# Patient Record
Sex: Female | Born: 1985 | Race: White | Hispanic: No | Marital: Single | Smoking: Former smoker
Health system: Southern US, Community
[De-identification: ages and names within clinical notes are randomized; demographics above are authoritative.]

## PROBLEM LIST (undated history)

## (undated) DIAGNOSIS — I10 Essential (primary) hypertension: Secondary | ICD-10-CM

## (undated) DIAGNOSIS — K589 Irritable bowel syndrome without diarrhea: Secondary | ICD-10-CM

## (undated) DIAGNOSIS — E282 Polycystic ovarian syndrome: Secondary | ICD-10-CM

## (undated) DIAGNOSIS — R000 Tachycardia, unspecified: Secondary | ICD-10-CM

## (undated) DIAGNOSIS — R519 Headache, unspecified: Secondary | ICD-10-CM

## (undated) DIAGNOSIS — K859 Acute pancreatitis without necrosis or infection, unspecified: Secondary | ICD-10-CM

## (undated) DIAGNOSIS — N809 Endometriosis, unspecified: Secondary | ICD-10-CM

## (undated) HISTORY — PX: LAPAROSCOPIC GASTRIC SLEEVE RESECTION: SHX5895

## (undated) HISTORY — DX: Polycystic ovarian syndrome: E28.2

## (undated) HISTORY — PX: APPENDECTOMY: SHX54

## (undated) HISTORY — DX: Acute pancreatitis without necrosis or infection, unspecified: K85.90

## (undated) HISTORY — PX: DILATION AND CURETTAGE OF UTERUS: SHX78

## (undated) HISTORY — DX: Irritable bowel syndrome without diarrhea: K58.9

## (undated) HISTORY — PX: CHOLECYSTECTOMY: SHX55

---

## 2010-03-22 DIAGNOSIS — O1490 Unspecified pre-eclampsia, unspecified trimester: Secondary | ICD-10-CM

## 2013-10-23 DIAGNOSIS — O1490 Unspecified pre-eclampsia, unspecified trimester: Secondary | ICD-10-CM

## 2014-04-07 HISTORY — PX: LAPAROSCOPIC GASTRIC SLEEVE RESECTION: SHX5895

## 2016-10-22 DIAGNOSIS — O1490 Unspecified pre-eclampsia, unspecified trimester: Secondary | ICD-10-CM

## 2017-06-23 DIAGNOSIS — G43909 Migraine, unspecified, not intractable, without status migrainosus: Secondary | ICD-10-CM | POA: Insufficient documentation

## 2017-06-23 DIAGNOSIS — K219 Gastro-esophageal reflux disease without esophagitis: Secondary | ICD-10-CM | POA: Insufficient documentation

## 2017-06-23 DIAGNOSIS — E282 Polycystic ovarian syndrome: Secondary | ICD-10-CM | POA: Insufficient documentation

## 2019-05-09 HISTORY — PX: APPENDECTOMY: SHX54

## 2019-10-18 ENCOUNTER — Encounter (HOSPITAL_COMMUNITY): Payer: Self-pay | Admitting: Emergency Medicine

## 2019-10-18 ENCOUNTER — Inpatient Hospital Stay (HOSPITAL_COMMUNITY)
Admission: EM | Admit: 2019-10-18 | Discharge: 2019-10-18 | Disposition: A | Discharge status: Home / Self Care | Payer: Medicaid Other | Attending: Family Medicine | Admitting: Family Medicine

## 2019-10-18 ENCOUNTER — Inpatient Hospital Stay (HOSPITAL_COMMUNITY): Payer: Medicaid Other

## 2019-10-18 ENCOUNTER — Other Ambulatory Visit: Payer: Self-pay

## 2019-10-18 DIAGNOSIS — O98811 Other maternal infectious and parasitic diseases complicating pregnancy, first trimester: Secondary | ICD-10-CM | POA: Diagnosis not present

## 2019-10-18 DIAGNOSIS — B3731 Acute candidiasis of vulva and vagina: Secondary | ICD-10-CM

## 2019-10-18 DIAGNOSIS — Z3491 Encounter for supervision of normal pregnancy, unspecified, first trimester: Secondary | ICD-10-CM

## 2019-10-18 DIAGNOSIS — Z3A08 8 weeks gestation of pregnancy: Secondary | ICD-10-CM | POA: Insufficient documentation

## 2019-10-18 DIAGNOSIS — O3411 Maternal care for benign tumor of corpus uteri, first trimester: Secondary | ICD-10-CM | POA: Diagnosis not present

## 2019-10-18 DIAGNOSIS — O10911 Unspecified pre-existing hypertension complicating pregnancy, first trimester: Secondary | ICD-10-CM | POA: Insufficient documentation

## 2019-10-18 DIAGNOSIS — D259 Leiomyoma of uterus, unspecified: Secondary | ICD-10-CM | POA: Insufficient documentation

## 2019-10-18 DIAGNOSIS — B373 Candidiasis of vulva and vagina: Secondary | ICD-10-CM | POA: Insufficient documentation

## 2019-10-18 DIAGNOSIS — O209 Hemorrhage in early pregnancy, unspecified: Secondary | ICD-10-CM | POA: Insufficient documentation

## 2019-10-18 DIAGNOSIS — O99891 Other specified diseases and conditions complicating pregnancy: Secondary | ICD-10-CM

## 2019-10-18 DIAGNOSIS — Z9049 Acquired absence of other specified parts of digestive tract: Secondary | ICD-10-CM | POA: Insufficient documentation

## 2019-10-18 DIAGNOSIS — O4691 Antepartum hemorrhage, unspecified, first trimester: Secondary | ICD-10-CM

## 2019-10-18 DIAGNOSIS — N939 Abnormal uterine and vaginal bleeding, unspecified: Secondary | ICD-10-CM

## 2019-10-18 HISTORY — DX: Endometriosis, unspecified: N80.9

## 2019-10-18 HISTORY — DX: Headache, unspecified: R51.9

## 2019-10-18 HISTORY — DX: Essential (primary) hypertension: I10

## 2019-10-18 HISTORY — DX: Tachycardia, unspecified: R00.0

## 2019-10-18 LAB — URINALYSIS, ROUTINE W REFLEX MICROSCOPIC
Bilirubin Urine: NEGATIVE
Glucose, UA: NEGATIVE mg/dL
Hgb urine dipstick: NEGATIVE
Ketones, ur: NEGATIVE mg/dL
Leukocytes,Ua: NEGATIVE
Nitrite: NEGATIVE
Protein, ur: NEGATIVE mg/dL
Specific Gravity, Urine: 1.014 (ref 1.005–1.030)
pH: 7 (ref 5.0–8.0)

## 2019-10-18 LAB — WET PREP, GENITAL
Clue Cells Wet Prep HPF POC: NONE SEEN
Sperm: NONE SEEN
Trich, Wet Prep: NONE SEEN

## 2019-10-18 LAB — CBC
HCT: 40.7 % (ref 36.0–46.0)
Hemoglobin: 13.3 g/dL (ref 12.0–15.0)
MCH: 29.4 pg (ref 26.0–34.0)
MCHC: 32.7 g/dL (ref 30.0–36.0)
MCV: 89.8 fL (ref 80.0–100.0)
Platelets: 279 10*3/uL (ref 150–400)
RBC: 4.53 MIL/uL (ref 3.87–5.11)
RDW: 11.9 % (ref 11.5–15.5)
WBC: 11.4 10*3/uL — ABNORMAL HIGH (ref 4.0–10.5)
nRBC: 0 % (ref 0.0–0.2)

## 2019-10-18 LAB — I-STAT BETA HCG BLOOD, ED (MC, WL, AP ONLY): I-stat hCG, quantitative: >2000 m[IU]/mL — ABNORMAL HIGH (ref <5)

## 2019-10-18 LAB — ABO/RH: ABO/RH(D): B POS

## 2019-10-18 LAB — HCG, QUANTITATIVE, PREGNANCY: hCG, Beta Chain, Quant, S: 114922 m[IU]/mL — ABNORMAL HIGH (ref <5)

## 2019-10-18 MED ORDER — TERCONAZOLE 0.4 % VA CREA
1.0000 | TOPICAL_CREAM | Freq: Every day | VAGINAL | 0 refills | Status: DC
Start: 2019-10-18 — End: 2019-11-23

## 2019-10-18 NOTE — ED Provider Notes (Signed)
Patient placed in Quick Look pathway, seen and evaluated   Chief Complaint: Vaginal bleeding in pregnanacy  HPI:   [id number] here with cramping and vaginal bleeding. LMP 08/19/2019  ROS: vaginal bleeding (one)  Physical Exam:   Gen: No distress  Neuro: Awake and Alert  Skin: Warm    Focused Exam: no abdominal tenderness   Spoke with Veronica, MAU APP who has accepted the patient.  Initiation of care has begun. The patient has been counseled on the process, plan, and necessity for staying for the completion/evaluation, and the remainder of the medical screening examination    Ned Grace 10/18/19 2219    Daleen Bo, MD 10/18/19 2302

## 2019-10-18 NOTE — ED Triage Notes (Signed)
PA Harris spoke to MAU provider, approval to send pt.  RN called Lauren in MAU and gave report.  RN for transport

## 2019-10-18 NOTE — ED Triage Notes (Signed)
Pt reports vaginal spotting today, she reports she is [redacted] weeks pregnant, just moved her and has not seen a provider yet.  She did have a miscarriage a year ago.  Reports some mild cramping and occasional sharp pains.

## 2019-10-18 NOTE — MAU Note (Signed)
Pt stated she has been having cramping and spotting that started earlier today. Pt hs Hx endometriosis.

## 2019-10-18 NOTE — MAU Provider Note (Signed)
History     CSN: [id number]  Arrival date and time: 10/18/19 2010   First Provider Initiated Contact with Patient 10/18/19 2224      Chief Complaint  Patient presents with  . Vaginal Bleeding   Momina Crystal Pineau is a 34 y.o. G6P3 at [redacted]w[redacted]d by LMP who presents to MAU with complaints of vaginal bleeding and abdominal pain. Patient reports symptoms started occurring today. Describes vaginal bleeding as dark brownish red spotting when she wipes, denies having to wear a pad or panty liner for vaginal bleeding. She reports lower abdominal cramping that is intermittent, describes as cramping that is sometimes sharp pain, rates 3/10- has not taken any medication for pain. Patient reports that she was recently diagnosed with endometriosis during her appendectomy in Feb 2021.    OB History    Gravida  6   Para  3   Term      Preterm  3   AB  2   Living  3     SAB  2   TAB      Ectopic      Multiple      Live Births  3           Past Medical History:  Diagnosis Date  . Endometriosis   . Headache   . Hypertension   . Tachycardia     Past Surgical History:  Procedure Laterality Date  . APPENDECTOMY    . CESAREAN SECTION    . LAPAROSCOPIC GASTRIC SLEEVE RESECTION      No family history on file.  Social History   Tobacco Use  . Smoking status: Never Smoker  . Smokeless tobacco: Never Used  Substance Use Topics  . Alcohol use: Not Currently  . Drug use: Never    Allergies:  Allergies  Allergen Reactions  . Latex Hives  . Shellfish Allergy Nausea And Vomiting    Medications Prior to Admission  Medication Sig Dispense Refill Last Dose  . folic acid (FOLVITE) 371 MCG tablet Take 400 mcg by mouth daily.     . METHYLDOPA PO Take 1 tablet by mouth every morning.     . Prenatal Vit-Fe Fumarate-FA (PRENATAL MULTIVITAMIN) TABS tablet Take 1 tablet by mouth daily at 12 noon.     . propranolol (INNOPRAN XL) 80 MG 24 hr capsule Take 80 mg by mouth at bedtime.        Review of Systems  Constitutional: Negative.   Respiratory: Negative.   Cardiovascular: Negative.   Gastrointestinal: Positive for abdominal pain. Negative for constipation, diarrhea, nausea and vomiting.  Genitourinary: Positive for vaginal bleeding. Negative for difficulty urinating, dysuria, frequency, pelvic pain and urgency.  Musculoskeletal: Negative.   Neurological: Negative.   Psychiatric/Behavioral: Negative.    Physical Exam   Blood pressure 117/83, pulse 83, temperature 98.8 F (37.1 C), temperature source Oral, resp. rate 16, height 5\' 4"  (1.626 m), weight 103 kg, last menstrual period 08/19/2019, SpO2 99 %.  Physical Exam Vitals and nursing note reviewed.  HENT:     Head: Normocephalic.  Cardiovascular:     Rate and Rhythm: Normal rate and regular rhythm.  Pulmonary:     Effort: Pulmonary effort is normal. No respiratory distress.     Breath sounds: Normal breath sounds. No wheezing.  Abdominal:     General: There is no distension.     Palpations: Abdomen is soft. There is no mass.     Tenderness: There is no abdominal tenderness. There is no guarding.  Genitourinary:    Comments: Blind swabs collected, dark brown curdy discharge noticed on swabs  Bimanual exam: Cervix 0/long/high, firm, anterior, neg CMT, uterus nontender, nonenlarged, adnexa without tenderness, enlargement, or mass  Skin:    General: Skin is warm and dry.  Neurological:     Mental Status: She is alert and oriented to person, place, and time.  Psychiatric:        Mood and Affect: Mood normal.        Behavior: Behavior normal.        Thought Content: Thought content normal.     MAU Course  Procedures  MDM Orders Placed This Encounter  Procedures  . Wet prep, genital  . US OB Comp Less 14 Wks  . CBC  . hCG, quantitative, pregnancy  . Urinalysis, Routine w reflex microscopic  . Diet NPO time specified  . I-Stat beta hCG blood, ED  . ABO/Rh   Labs and [place] report reviewed:   Results for orders placed or performed during the hospital encounter of 10/18/19 (from the past 24 hour(s))  I-Stat beta hCG blood, ED     Status: Abnormal   Collection Time: 10/18/19  8:44 PM  Result Value Ref Range   I-stat hCG, quantitative >2,000.0 (H) <5 mIU/mL   Comment 3          CBC     Status: Abnormal   Collection Time: 10/18/19 10:10 PM  Result Value Ref Range   WBC 11.4 (H) 4.0 - 10.5 K/uL   RBC 4.53 3.87 - 5.11 MIL/uL   Hemoglobin 13.3 12.0 - 15.0 g/dL   HCT 40.7 36 - 46 %   MCV 89.8 80.0 - 100.0 fL   MCH 29.4 26.0 - 34.0 pg   MCHC 32.7 30.0 - 36.0 g/dL   RDW 11.9 11.5 - 15.5 %   Platelets 279 150 - 400 K/uL   nRBC 0.0 0.0 - 0.2 %  ABO/Rh     Status: None   Collection Time: 10/18/19 10:10 PM  Result Value Ref Range   ABO/RH(D) B POS    No rh immune globuloin      NOT A RH IMMUNE GLOBULIN CANDIDATE, PT RH POSITIVE Performed at [facility], [place] [address], [place], [place] [address]   hCG, quantitative, pregnancy     Status: Abnormal   Collection Time: 10/18/19 10:10 PM  Result Value Ref Range   hCG, Beta Chain, Laqueta Carina 784,696 (H) <5 mIU/mL  Wet prep, genital     Status: Abnormal   Collection Time: 10/18/19 10:15 PM   Specimen: Vaginal  Result Value Ref Range   Yeast Wet Prep HPF POC PRESENT (A) NONE SEEN   Trich, Wet Prep NONE SEEN NONE SEEN   Clue Cells Wet Prep HPF POC NONE SEEN NONE SEEN   WBC, Wet Prep HPF POC MANY (A) NONE SEEN   Sperm NONE SEEN   Urinalysis, Routine w reflex microscopic     Status: Abnormal   Collection Time: 10/18/19 10:15 PM  Result Value Ref Range   Color, Urine YELLOW YELLOW   APPearance HAZY (A) CLEAR   Specific Gravity, Urine 1.014 1.005 - 1.030   pH 7.0 5.0 - 8.0   Glucose, UA NEGATIVE NEGATIVE mg/dL   Hgb urine dipstick NEGATIVE NEGATIVE   Bilirubin Urine NEGATIVE NEGATIVE   Ketones, ur NEGATIVE NEGATIVE mg/dL   Protein, ur NEGATIVE NEGATIVE mg/dL   Nitrite NEGATIVE NEGATIVE   Leukocytes,Ua NEGATIVE NEGATIVE    US OB  Comp Less 14 Wks  Result Date: 10/18/2019 CLINICAL DATA:  34 year old pregnant female with spotting. LMP: 08/19/2019 corresponding to an estimated gestational age of [redacted] weeks, 4 days. EXAM: OBSTETRIC <14 WK ULTRASOUND TECHNIQUE: Transabdominal ultrasound was performed for evaluation of the gestation as well as the maternal uterus and adnexal regions. COMPARISON:  None. FINDINGS: Intrauterine gestational sac: Single intrauterine gestational sac. Yolk sac:  Seen Embryo:  Present Cardiac Activity: Detected Heart Rate: 176 bpm CRL: 18 mm   8 w 2 d                  [place] EDC: 05/27/2020 Subchorionic hemorrhage:  None visualized. Maternal uterus/adnexae: There is a 2.9 x 2.3 x 2.0 cm anterior body fibroid. The ovaries are unremarkable. There is a corpus luteum in the right ovary. IMPRESSION: Single live intrauterine pregnancy with an estimated gestational age of [redacted] weeks, 2 days based on today's ultrasound concordant with age based on LMP. Electronically Signed   By: Anner Crete M.D.   On: 10/18/2019 22:58   Discussed results of [place] and lab work with patient. Discussed with patient that she does have yeast infection which explains discharge, Rx for terazol sent to pharmacy of choice. Normal IUP with FHR noted on [place]. Encouraged patient to make initial prenatal visit.   Discussed reasons to return to MAU. Return to MAU as needed. Pt stable at time of discharge.   Assessment and Plan   1. Normal IUP (intrauterine pregnancy) on prenatal ultrasound, first trimester   2. Vaginal spotting   3. [redacted] weeks gestation of pregnancy   4. Vulvovaginal candidiasis    Discharge home Make initial prenatal visit to start care  Return to MAU as needed for reasons discussed and/or emergencies  Rx for Terazol     Allergies as of 10/18/2019      Reactions   Latex Hives   Shellfish Allergy Nausea And Vomiting      Medication List    TAKE these medications   folic acid 357 MCG tablet Commonly known as:  FOLVITE Take 400 mcg by mouth daily.   METHYLDOPA PO Take 1 tablet by mouth every morning.   prenatal multivitamin Tabs tablet Take 1 tablet by mouth daily at 12 noon.   propranolol 80 MG 24 hr capsule Commonly known as: INNOPRAN XL Take 80 mg by mouth at bedtime.   terconazole 0.4 % vaginal cream Commonly known as: TERAZOL 7 Place 1 applicator vaginally at bedtime. Use for five days       Lajean Manes [facility]  10/19/2019, 12:50 AM

## 2019-10-19 LAB — GC/CHLAMYDIA PROBE AMP (~~LOC~~) NOT AT ARMC
Chlamydia: NEGATIVE
Comment: NEGATIVE
Comment: NORMAL
Neisseria Gonorrhea: NEGATIVE

## 2019-11-11 ENCOUNTER — Inpatient Hospital Stay (HOSPITAL_COMMUNITY)
Admission: AD | Admit: 2019-11-11 | Discharge: 2019-11-11 | Disposition: A | Discharge status: Home / Self Care | Payer: Medicaid Other | Attending: Obstetrics and Gynecology | Admitting: Obstetrics and Gynecology

## 2019-11-11 ENCOUNTER — Encounter (HOSPITAL_COMMUNITY): Payer: Self-pay | Admitting: Obstetrics and Gynecology

## 2019-11-11 ENCOUNTER — Other Ambulatory Visit: Payer: Self-pay

## 2019-11-11 DIAGNOSIS — R519 Headache, unspecified: Secondary | ICD-10-CM | POA: Insufficient documentation

## 2019-11-11 DIAGNOSIS — O98811 Other maternal infectious and parasitic diseases complicating pregnancy, first trimester: Secondary | ICD-10-CM | POA: Insufficient documentation

## 2019-11-11 DIAGNOSIS — A059 Bacterial foodborne intoxication, unspecified: Secondary | ICD-10-CM | POA: Diagnosis not present

## 2019-11-11 DIAGNOSIS — O26891 Other specified pregnancy related conditions, first trimester: Secondary | ICD-10-CM | POA: Diagnosis not present

## 2019-11-11 DIAGNOSIS — Z3A12 12 weeks gestation of pregnancy: Secondary | ICD-10-CM | POA: Insufficient documentation

## 2019-11-11 DIAGNOSIS — O10911 Unspecified pre-existing hypertension complicating pregnancy, first trimester: Secondary | ICD-10-CM | POA: Insufficient documentation

## 2019-11-11 DIAGNOSIS — Z79899 Other long term (current) drug therapy: Secondary | ICD-10-CM | POA: Insufficient documentation

## 2019-11-11 DIAGNOSIS — Z9049 Acquired absence of other specified parts of digestive tract: Secondary | ICD-10-CM | POA: Insufficient documentation

## 2019-11-11 LAB — URINALYSIS, ROUTINE W REFLEX MICROSCOPIC
Bilirubin Urine: NEGATIVE
Glucose, UA: NEGATIVE mg/dL
Hgb urine dipstick: NEGATIVE
Ketones, ur: NEGATIVE mg/dL
Leukocytes,Ua: NEGATIVE
Nitrite: NEGATIVE
Protein, ur: NEGATIVE mg/dL
Specific Gravity, Urine: 1.017 (ref 1.005–1.030)
pH: 8 (ref 5.0–8.0)

## 2019-11-11 MED ORDER — ACETAMINOPHEN 500 MG PO TABS
1000.0000 mg | ORAL_TABLET | Freq: Once | ORAL | Status: AC
  Administered 2019-11-11: 1000 mg via ORAL
  Filled 2019-11-11: qty 2

## 2019-11-11 MED ORDER — ONDANSETRON 4 MG PO TBDP
8.0000 mg | ORAL_TABLET | Freq: Once | ORAL | Status: AC
  Administered 2019-11-11: 8 mg via ORAL
  Filled 2019-11-11: qty 2

## 2019-11-11 MED ORDER — LOPERAMIDE HCL 2 MG PO CAPS
2.0000 mg | ORAL_CAPSULE | Freq: Four times a day (QID) | ORAL | 0 refills | Status: DC | PRN
Start: 2019-11-11 — End: 2019-11-23

## 2019-11-11 MED ORDER — ONDANSETRON 4 MG PO TBDP
4.0000 mg | ORAL_TABLET | Freq: Three times a day (TID) | ORAL | 0 refills | Status: DC | PRN
Start: 2019-11-11 — End: 2020-02-03

## 2019-11-11 NOTE — ED Provider Notes (Signed)
34 yo female with complaint of diarrhea and vomiting with cramping after eating out (ate steak with potatoes). Denies fevers, chills, urinary symptoms, vaginal bleeding. Diarrhea described as loose stools x 3 episodes, non bloody. Vomiting x 3-4 times, non bloody. LMP5/13/21, prenatal care- [facility]st [facility]  G6P3 (loss at 11 weeks 10/2018, sp ab at 5weeks 01/2019), 3 live births premature.   PE: abdomen soft, non tender, well appearing, vitals reassuring.   Discussed with MAU APP Elmyra Ricks, patient may go to MAU for further evaluation.   MSE was initiated and I personally evaluated the patient and placed orders (if any) at  8:52 PM on November 11, 2019.  The patient appears stable so that the remainder of the MSE may be completed by another provider.   Tacy Learn, PA-C 11/11/19 2052    Virgel Manifold, MD 11/11/19 [phone]

## 2019-11-11 NOTE — MAU Note (Signed)
Pt transferred from ED with complaint of nausea and vomiting and diarrhea after eating today.

## 2019-11-11 NOTE — ED Triage Notes (Signed)
Pt c/o nausea/vomiting/diarrhea that started today after eating. Pt is [redacted] weeks pregnant.

## 2019-11-11 NOTE — MAU Provider Note (Signed)
History     CSN: [id number]  Arrival date and time: 11/11/19 2039   First Provider Initiated Contact with Patient 11/11/19 2156      Chief Complaint  Patient presents with  . Emesis   Megan Mcneil is a 34 y.o. [id number] at [redacted]w[redacted]d who receives care at [facility].  She presents today for Emesis and Diarrhea.  Patient states she went out to eat around 430pm at [facility].  She ate steak tips (medium) and mashed potatoes.  She reports she started having nausea around 630pm and then the onset of abdominal cramping, diarrhea, and vomiting. Patient states she has not had any incidents of vomiting, cramping, or diarrhea since arrival.  She also reports headache in her left temporal area that is a 4/10.     OB History    Gravida  6   Para  3   Term      Preterm  3   AB  2   Living  3     SAB  2   TAB      Ectopic      Multiple      Live Births  3           Past Medical History:  Diagnosis Date  . Endometriosis   . Headache   . Hypertension   . Tachycardia     Past Surgical History:  Procedure Laterality Date  . APPENDECTOMY    . CESAREAN SECTION    . CHOLECYSTECTOMY    . LAPAROSCOPIC GASTRIC SLEEVE RESECTION      History reviewed. No pertinent family history.  Social History   Tobacco Use  . Smoking status: Never Smoker  . Smokeless tobacco: Never Used  Substance Use Topics  . Alcohol use: Not Currently  . Drug use: Never    Allergies:  Allergies  Allergen Reactions  . Latex Hives  . Shellfish Allergy Nausea And Vomiting    Medications Prior to Admission  Medication Sig Dispense Refill Last Dose  . folic acid (FOLVITE) 778 MCG tablet Take 400 mcg by mouth daily.   11/11/2019 at Unknown time  . METHYLDOPA PO Take 1 tablet by mouth every morning.   11/11/2019 at Unknown time  . Prenatal Vit-Fe Fumarate-FA (PRENATAL MULTIVITAMIN) TABS tablet Take 1 tablet by mouth daily at 12 noon.   11/11/2019 at Unknown time  . propranolol (INNOPRAN XL) 80 MG 24 hr  capsule Take 80 mg by mouth at bedtime.   Unknown at Unknown time  . terconazole (TERAZOL 7) 0.4 % vaginal cream Place 1 applicator vaginally at bedtime. Use for five days 45 g 0     Review of Systems  Constitutional: Negative for chills and fever.  Respiratory: Negative for cough and shortness of breath.   Gastrointestinal: Positive for abdominal pain (Cramping earlier, none currently. ), diarrhea (Last bout at 2000), nausea and vomiting (Last vomited at 2000). Negative for constipation.  Genitourinary: Negative for difficulty urinating, dysuria, vaginal bleeding and vaginal discharge.  Neurological: Positive for headaches (4/10).   Physical Exam   Blood pressure 116/71, pulse 89, temperature 98.3 F (36.8 C), temperature source Oral, resp. rate 17, last menstrual period 08/19/2019, SpO2 99 %.  Physical Exam Vitals reviewed.  Constitutional:      General: She is not in acute distress.    Appearance: Normal appearance. She is obese. She is not ill-appearing or toxic-appearing.  HENT:     Head: Normocephalic and atraumatic.  Eyes:     Conjunctiva/sclera: Conjunctivae  normal.  Cardiovascular:     Rate and Rhythm: Normal rate.  Pulmonary:     Effort: Pulmonary effort is normal. No respiratory distress.  Musculoskeletal:        General: Normal range of motion.     Cervical back: Normal range of motion.  Neurological:     Mental Status: She is alert and oriented to person, place, and time.  Psychiatric:        Mood and Affect: Mood normal.        Behavior: Behavior normal.        Thought Content: Thought content normal.        Judgment: Judgment normal.     MAU Course  Procedures Results for orders placed or performed during the hospital encounter of 11/11/19 (from the past 24 hour(s))  Urinalysis, Routine w reflex microscopic Urine, Clean Catch     Status: Abnormal   Collection Time: 11/11/19  9:19 PM  Result Value Ref Range   Color, Urine YELLOW YELLOW   APPearance  CLOUDY (A) CLEAR   Specific Gravity, Urine 1.017 1.005 - 1.030   pH 8.0 5.0 - 8.0   Glucose, UA NEGATIVE NEGATIVE mg/dL   Hgb urine dipstick NEGATIVE NEGATIVE   Bilirubin Urine NEGATIVE NEGATIVE   Ketones, ur NEGATIVE NEGATIVE mg/dL   Protein, ur NEGATIVE NEGATIVE mg/dL   Nitrite NEGATIVE NEGATIVE   Leukocytes,Ua NEGATIVE NEGATIVE    MDM Antiemetic Pain medication Assessment and Plan  34 year old, [id number]  SIUP at 12weeks Food Poisoning Headache  -Reviewed POC with patient. -Informed that symptoms from food poisoning. -Reviewed food poisoning and symptom management. -Informed symptoms should resolve after ~24 hours if not sooner. -Will give zofran for nausea and tylenol for HA.  Maryann Conners 11/11/2019, 9:56 PM   Reassessment (22:35 AM) -Patient reports improvement in nausea with zofran dosing. -Will give tylenol and discharge to home. -Patient informed that zofran and imodium would be sent for home usage. -Patient without questions or concerns. -Encouraged to call or return to MAU if symptoms worsen or with the onset of new symptoms. -Will give OOW note for tonight and tomorrow. -Discharged to home in stable condition.  Maryann Conners MSN, CNM Advanced Practice Provider, [facility]

## 2019-11-11 NOTE — ED Notes (Signed)
Pt seen by Suella Broad, PA. Report called to MAU.

## 2019-11-11 NOTE — Discharge Instructions (Signed)
Food Poisoning Food poisoning is an illness that is caused by eating or drinking contaminated foods or drinks. In most cases, food poisoning is mild and lasts 1-2 days. However, some cases can be serious, especially for people who have weak body defense systems (immune systems), older people, children and infants, and pregnant women. What are the causes? This condition is caused by contaminated food. Foods can become contaminated with viruses, bacteria, parasites, or mold due to:  Poor personal hygiene, such as poor hand-washing practices.  Storing food improperly, such as not refrigerating raw meat.  Using unclean surfaces for preparing, serving, and storing food.  Cooking or eating with unclean utensils. If contaminated food is eaten, viruses, bacteria, or parasites can harm the intestine. This often causes severe diarrhea. The most common causes of food poisoning include:  Viruses, such as: ? Norovirus. ? Rotavirus.  Bacteria, such as: ? Salmonella. ? Listeria. ? E. coli (Escherichia coli).  Parasites, such as: ? Giardia. ? Toxoplasma gondii. What are the signs or symptoms? Symptoms may take several hours to appear after you consume contaminated food or drink. Symptoms include:  Nausea.  Vomiting.  Cramping.  Diarrhea.  Fever and chills.  Muscle aches.  Dehydration. Dehydration can cause you to be tired and thirsty, have a dry mouth, and urinate less frequently. How is this diagnosed? Your health care provider can diagnose food poisoning with your medical history and a physical exam. This will include asking you what you have recently eaten. You may also have tests, including:  Blood tests.  Stool tests. How is this treated? Treatment focuses on relieving your symptoms and making sure that you are hydrated. You may also be given medicines. In severe cases, hospitalization may be required and you may need to receive fluids through an IV. Follow these instructions  at home: Eating and drinking   Drink enough fluids to keep your urine pale yellow. You may need to drink small amounts of clear liquids frequently.  Avoid milk, caffeine, and alcohol.  Ask your health care provider for specific rehydration instructions.  Eat small, frequent meals rather than large meals. Medicines  Take over-the-counter and prescription medicines only as told by your health care provider. Ask your health care provider if you should continue to take any of your regular prescribed and over-the-counter medicines.  If you were prescribed an antibiotic medicine, take it as told by your health care provider. Do not stop taking the antibiotic even if you start to feel better. General instructions   Wash your hands thoroughly before you prepare food and after you go to the bathroom (use the toilet). Make sure that the people who live with you also wash their hands often.  Rest at home until you feel better.  Clean surfaces that you touch with a product that contains chlorine bleach.  Keep all follow-up visits as told by your health care provider. This is important. How is this prevented?  Wash your hands, food preparation surfaces, and utensils thoroughly before and after you handle raw foods.  Use separate food preparation surfaces and storage spaces for raw meat and for fruits and vegetables.  Keep refrigerated foods colder than 21F (5C).  Serve hot foods immediately or keep them heated above 121F (60C).  Store dry foods in cool, dry spaces away from excess heat or moisture. Throw out any foods that do not smell right or are in cans that are bulging.  Follow approved canning procedures.  Heat canned foods thoroughly before you taste  them.  Drink bottled or sterile water when you travel. Get help right away if:  You have difficulty breathing, swallowing, talking, or moving.  You develop blurred vision.  You cannot eat or drink without vomiting.  You  faint.  Your eyes turn yellow.  Your vomiting or diarrhea is persistent.  Abdominal pain develops, increases, or localizes in one small area.  You have a fever.  You have blood or mucus in your stools, or your stools look dark black and tarry.  You have signs of dehydration, such as: ? Dark urine, very little urine, or no urine. ? Cracked lips. ? Not making tears while crying. ? Dry mouth. ? Sunken eyes. ? Sleepiness. ? Weakness. ? Dizziness. These symptoms may represent a serious problem that is an emergency. Do not wait to see if the symptoms will go away. Get medical help right away. Call your local emergency services (911 in the [place] Do not drive yourself to the hospital. Summary  Food poisoning is an illness that is caused by eating or drinking contaminated foods or drinks.  Symptoms may include nausea, vomiting, diarrhea, muscle aches, cramping, fever, chills, and dehydration.  In most cases, food poisoning is mild and lasts 1-2 days.  In severe cases, hospitalization may be required. This information is not intended to replace advice given to you by your health care provider. Make sure you discuss any questions you have with your health care provider. Document Revised: 01/06/2018 Document Reviewed: 01/06/2018 Elsevier Patient Education  2020 [employer].

## 2019-11-16 ENCOUNTER — Ambulatory Visit: Payer: Medicaid Other

## 2019-11-16 ENCOUNTER — Telehealth: Payer: Self-pay

## 2019-11-16 DIAGNOSIS — Z3490 Encounter for supervision of normal pregnancy, unspecified, unspecified trimester: Secondary | ICD-10-CM | POA: Insufficient documentation

## 2019-11-16 MED ORDER — BLOOD PRESSURE MONITOR KIT
1.0000 | PACK | 0 refills | Status: AC
Start: 2019-11-16 — End: ?

## 2019-11-16 NOTE — Progress Notes (Signed)
PRENATAL INTAKE SUMMARY  Megan Mcneil presents today New OB Nurse Interview.  OB History    Gravida  6   Para  3   Term      Preterm  3   AB  2   Living  3     SAB  2   TAB      Ectopic      Multiple      Live Births  3          I have reviewed the patient's medical, obstetrical, social, and family histories, medications, and available lab results.  SUBJECTIVE She has no unusual complaints and wants to discuss medication management for Tachycardia I let pt note I would send a message to provider.   OBJECTIVE Initial Physical Exam (New OB)   ASSESSMENT High Risk Pregnancy Hx of Preterm delivery    PLAN Prenatal care B/P Cuff Sent

## 2019-11-16 NOTE — Telephone Encounter (Signed)
Who prescribed the Propanolol? She can talk to that provider. But if her pulse is okay, no need to take medication.   Verita Schneiders, MD

## 2019-11-16 NOTE — Telephone Encounter (Signed)
Completed NOB Intake today  Pt states she is still unsure if she can take Rx Propranolol 80 mg for Tachycardia. Pt states she has been feeling well and has not been taking it until she discussed with a Provider.  I let pt know I would send a message to a MD and let her know how to manage medication.   Pt agreeable and voiced understanding.

## 2019-11-17 NOTE — Progress Notes (Signed)
Patient was assessed and managed by nursing staff during this encounter. I have reviewed the chart and agree with the documentation and plan. I have also made any necessary editorial changes.  Verita Schneiders, MD 11/17/2019 8:14 AM

## 2019-11-23 ENCOUNTER — Ambulatory Visit (INDEPENDENT_AMBULATORY_CARE_PROVIDER_SITE_OTHER): Payer: Medicaid Other | Admitting: Nurse Practitioner

## 2019-11-23 ENCOUNTER — Other Ambulatory Visit (HOSPITAL_COMMUNITY)
Admission: RE | Admit: 2019-11-23 | Discharge: 2019-11-23 | Disposition: A | Discharge status: Home / Self Care | Payer: Medicaid Other | Source: Ambulatory Visit | Attending: Nurse Practitioner | Admitting: Nurse Practitioner

## 2019-11-23 ENCOUNTER — Other Ambulatory Visit: Payer: Self-pay

## 2019-11-23 VITALS — BP 125/85 | HR 96 | Wt 222.8 lb

## 2019-11-23 DIAGNOSIS — Z6839 Body mass index (BMI) 39.0-39.9, adult: Secondary | ICD-10-CM

## 2019-11-23 DIAGNOSIS — O0991 Supervision of high risk pregnancy, unspecified, first trimester: Secondary | ICD-10-CM

## 2019-11-23 DIAGNOSIS — I10 Essential (primary) hypertension: Secondary | ICD-10-CM

## 2019-11-23 DIAGNOSIS — O0990 Supervision of high risk pregnancy, unspecified, unspecified trimester: Secondary | ICD-10-CM | POA: Diagnosis not present

## 2019-11-23 DIAGNOSIS — Z3A13 13 weeks gestation of pregnancy: Secondary | ICD-10-CM

## 2019-11-23 DIAGNOSIS — O09891 Supervision of other high risk pregnancies, first trimester: Secondary | ICD-10-CM | POA: Diagnosis not present

## 2019-11-23 DIAGNOSIS — O09899 Supervision of other high risk pregnancies, unspecified trimester: Secondary | ICD-10-CM | POA: Diagnosis not present

## 2019-11-23 DIAGNOSIS — Z98891 History of uterine scar from previous surgery: Secondary | ICD-10-CM

## 2019-11-23 DIAGNOSIS — O210 Mild hyperemesis gravidarum: Secondary | ICD-10-CM

## 2019-11-23 DIAGNOSIS — Z3482 Encounter for supervision of other normal pregnancy, second trimester: Secondary | ICD-10-CM

## 2019-11-23 MED ORDER — DOXYLAMINE-PYRIDOXINE 10-10 MG PO TBEC: DELAYED_RELEASE_TABLET | ORAL | 2 refills | Status: AC

## 2019-11-23 MED ORDER — ASPIRIN EC 81 MG PO TBEC
81.0000 mg | DELAYED_RELEASE_TABLET | Freq: Every day | ORAL | 6 refills | Status: AC
Start: 2019-11-23 — End: ?

## 2019-11-23 MED ORDER — LABETALOL HCL 100 MG PO TABS: 100.0000 mg | ORAL_TABLET | Freq: Two times a day (BID) | ORAL | 2 refills | Status: AC

## 2019-11-23 NOTE — Progress Notes (Signed)
Pt. Presents for initial OB visit. Pt. Is having trouble keeping food down - states throwing up at least once a day

## 2019-11-23 NOTE — Progress Notes (Signed)
Subjective:   Megan Mcneil is a 34 y.o. [id number] at [redacted]w[redacted]d by LMP being seen today for her first obstetrical visit.  Her obstetrical history is significant for pre-eclampsia and preterm births, chronic hypertension on medication, hx of C/S x 2, Hx of gastric bypass, obesity, Hx of pancreatitis and has had even after cholecyectomy, recently moved to [facility]. Patient plans to pump and feed breastmilk - has always had difficulty with latch due to flat nipples intend to breast feed. Pregnancy history fully reviewed.  Patient reports nausea.  HISTORY: OB History  Gravida Para Term Preterm AB Living  6 3 0 $R'3 2 3  'tj$ SAB TAB Ectopic Multiple Live Births  2 0 0 0 3    # Outcome Date GA Lbr Len/2nd Weight Sex Delivery Anes PTL Lv  6 Current           5 Preterm 10/22/16 [redacted]w[redacted]d   M CS-LTranv   LIV  4 Preterm 10/23/13 [redacted]w[redacted]d   M CS-LTranv   LIV  3 Preterm 03/22/10 [redacted]w[redacted]d   M Vag-Spont   LIV  2 SAB           1 SAB            Past Medical History:  Diagnosis Date  . Endometriosis   . Headache   . Hypertension   . IBS (irritable bowel syndrome)    w/ Diarrhea  . Pancreatitis   . PCOS (polycystic ovarian syndrome)   . Tachycardia    Past Surgical History:  Procedure Laterality Date  . APPENDECTOMY    . CESAREAN SECTION    . CHOLECYSTECTOMY    . LAPAROSCOPIC GASTRIC SLEEVE RESECTION     No family history on file. Social History   Tobacco Use  . Smoking status: Never Smoker  . Smokeless tobacco: Never Used  Vaping Use  . Vaping Use: Never used  Substance Use Topics  . Alcohol use: Not Currently  . Drug use: Never   Allergies  Allergen Reactions  . Latex Hives  . Shellfish Allergy Nausea And Vomiting   Current Outpatient Medications on File Prior to Visit  Medication Sig Dispense Refill  . folic acid (FOLVITE) 127 MCG tablet Take 400 mcg by mouth daily.    . METHYLDOPA PO Take 1 tablet by mouth every morning.    . ondansetron (ZOFRAN ODT) 4 MG disintegrating tablet Take 1  tablet (4 mg total) by mouth every 8 (eight) hours as needed for nausea or vomiting. 20 tablet 0  . Prenatal Vit-Fe Fumarate-FA (PRENATAL MULTIVITAMIN) TABS tablet Take 1 tablet by mouth daily at 12 noon.    . Blood Pressure Monitor KIT 1 Device by Does not apply route once a week. To be monitored Regularly at home. 1 kit 0   No current facility-administered medications on file prior to visit.     Exam   Vitals:   11/23/19 0908  BP: 125/85  Pulse: 96  Weight: 222 lb 12.8 oz (101.1 kg)   Fetal Heart Rate (bpm): 162  Uterus:     Pelvic Exam: Perineum: no hemorrhoids, normal perineum   Vulva: normal external genitalia, no lesions   Vagina:  normal mucosa, normal discharge   Cervix: no lesions and normal, pap smear done.    Adnexa: normal adnexa and no mass, fullness, tenderness   Bony Pelvis: average  System: General: well-developed, well-nourished female in no acute distress   Breast:  Very flat nipples, no masses or tenderness   Skin:  normal coloration and turgor, no rashes   Neurologic: oriented, normal, negative, normal mood   Extremities: normal strength, tone, and muscle mass, ROM of all joints is normal   HEENT extraocular movement intact and sclera clear, anicteric   Mouth/Teeth deferred   Neck supple and no masses, normal thyroid   Cardiovascular: regular rate and rhythm   Respiratory:  no respiratory distress, normal breath sounds   Abdomen: soft, non-tender; no masses,  no organomegaly     Assessment:   Pregnancy: [id number] Patient Active Problem List   Diagnosis Date Noted  . History of preterm delivery, currently pregnant, unspecified trimester 11/23/2019  . Supervision of high risk pregnancy 11/16/2019     Plan:  1. Supervision of high risk pregnancy, antepartum Moved from [place] and was seen in MAU on 10-18-19 - note reviewed Care Everywhere info reviewed  - Genetic Screening - CBC/D/Plt+RPR+Rh+ABO+Rub Ab... - Culture, OB Urine - Cervicovaginal  ancillary only - Cytology - PAP - Hemoglobin A1c  2. History of preterm delivery, currently pregnant, unspecified trimester All preterm births were due to exacerbation of pre eclampsia  3. Morning sickness Doing well with the Zofran she currently has Advised that constipation can occur - but is not currently happening.  4. Chronic hypertension Consult with Dr. Elgie Congo.  Will have client stop Aldomet and begin Labetalol 100 mg PO BID as labetalol is better for titration later in pregnancy if BP becomes elevated. Will get baseline labs Ordered and discussed beginning low dose aspirin with client  - Comprehensive metabolic panel - Protein / creatinine ratio, urine  5. History of cesarean section First baby at 14 weeks and had vaginal birth Next 2 births were preterm - one at 38 weeks and one at 1 weeks and both were C/S  6. BMI 39.0-39.9,adult    Initial labs drawn. Continue prenatal vitamins. Genetic Screening discussed, NIPS: ordered. Ultrasound discussed; fetal anatomic survey: will order at next visit. Problem list reviewed and updated. The nature of [facility] with multiple MDs and other Advanced Practice Providers was explained to patient; also emphasized that residents, students are part of our team. Routine obstetric precautions reviewed. Return in about 4 weeks (around 12/21/2019) for [place] with MD for [facility].  Total face-to-face time with patient: 40 minutes.  Over 50% of encounter was spent on counseling and coordination of care.     Earlie Server, FNP Family Nurse Practitioner, [facility] for [employer], [facility] 11/23/2019 5:16 PM

## 2019-11-23 NOTE — Patient Instructions (Addendum)
YES take the Aspirin and your new BP medication  Morning Sickness  Morning sickness is when you feel sick to your stomach (nauseous) during pregnancy. You may feel sick to your stomach and throw up (vomit). You may feel sick in the morning, but you can feel this way at any time of day. Some women feel very sick to their stomach and cannot stop throwing up (hyperemesis gravidarum). Follow these instructions at home: Medicines  Take over-the-counter and prescription medicines only as told by your doctor. Do not take any medicines until you talk with your doctor about them first.  Taking multivitamins before getting pregnant can stop or lessen the harshness of morning sickness. Eating and drinking  Eat dry toast or crackers before getting out of bed.  Eat 5 or 6 small meals a day.  Eat dry and bland foods like rice and baked potatoes.  Do not eat greasy, fatty, or spicy foods.  Have someone cook for you if the smell of food causes you to feel sick or throw up.  If you feel sick to your stomach after taking prenatal vitamins, take them at night or with a snack.  Eat protein when you need a snack. Nuts, yogurt, and cheese are good choices.  Drink fluids throughout the day.  Try ginger ale made with real ginger, ginger tea made from fresh grated ginger, or ginger candies. General instructions  Do not use any products that have nicotine or tobacco in them, such as cigarettes and e-cigarettes. If you need help quitting, ask your doctor.  Use an air purifier to keep the air in your house free of smells.  Get lots of fresh air.  Try to avoid smells that make you feel sick.  Try: ? Wearing a bracelet that is used for seasickness (acupressure wristband). ? Going to a doctor who puts thin needles into certain body points (acupuncture) to improve how you feel. Contact a doctor if:  You need medicine to feel better.  You feel dizzy or light-headed.  You are losing weight. Get help  right away if:  You feel very sick to your stomach and cannot stop throwing up.  You pass out (faint).  You have very bad pain in your belly. Summary  Morning sickness is when you feel sick to your stomach (nauseous) during pregnancy.  You may feel sick in the morning, but you can feel this way at any time of day.  Making some changes to what you eat may help your symptoms go away. This information is not intended to replace advice given to you by your health care provider. Make sure you discuss any questions you have with your health care provider. Document Revised: 03/06/2017 Document Reviewed: 04/24/2016 Elsevier Patient Education  2020 [employer].

## 2019-11-24 LAB — CERVICOVAGINAL ANCILLARY ONLY
Bacterial Vaginitis (gardnerella): NEGATIVE
Candida Glabrata: NEGATIVE
Candida Vaginitis: NEGATIVE
Chlamydia: NEGATIVE
Comment: NEGATIVE
Comment: NEGATIVE
Comment: NEGATIVE
Comment: NEGATIVE
Comment: NEGATIVE
Comment: NORMAL
Neisseria Gonorrhea: NEGATIVE
Trichomonas: NEGATIVE

## 2019-11-25 LAB — HEMOGLOBIN A1C
Est. average glucose Bld gHb Est-mCnc: 111 mg/dL
Hgb A1c MFr Bld: 5.5 % (ref 4.8–5.6)

## 2019-11-25 LAB — CBC/D/PLT+RPR+RH+ABO+RUB AB...
Antibody Screen: NEGATIVE
Basophils Absolute: 0.1 10*3/uL (ref 0.0–0.2)
Basos: 1 %
EOS (ABSOLUTE): 0.1 10*3/uL (ref 0.0–0.4)
Eos: 1 %
HCV Ab: <0.1 s/co ratio (ref 0.0–0.9)
HIV Screen 4th Generation wRfx: NONREACTIVE
Hematocrit: 39.7 % (ref 34.0–46.6)
Hemoglobin: 13.1 g/dL (ref 11.1–15.9)
Hepatitis B Surface Ag: NEGATIVE
Immature Grans (Abs): 0 10*3/uL (ref 0.0–0.1)
Immature Granulocytes: 0 %
Lymphocytes Absolute: 2.1 10*3/uL (ref 0.7–3.1)
Lymphs: 19 %
MCH: 29.8 pg (ref 26.6–33.0)
MCHC: 33 g/dL (ref 31.5–35.7)
MCV: 90 fL (ref 79–97)
Monocytes Absolute: 0.6 10*3/uL (ref 0.1–0.9)
Monocytes: 6 %
Neutrophils Absolute: 8.2 10*3/uL — ABNORMAL HIGH (ref 1.4–7.0)
Neutrophils: 73 %
Platelets: 254 10*3/uL (ref 150–450)
RBC: 4.39 x10E6/uL (ref 3.77–5.28)
RDW: 12.5 % (ref 11.7–15.4)
RPR Ser Ql: NONREACTIVE
Rh Factor: POSITIVE
Rubella Antibodies, IGG: 4.38 index (ref >0.99)
WBC: 11 10*3/uL — ABNORMAL HIGH (ref 3.4–10.8)

## 2019-11-25 LAB — COMPREHENSIVE METABOLIC PANEL
ALT: 9 IU/L (ref 0–32)
AST: 12 IU/L (ref 0–40)
Albumin/Globulin Ratio: 1.5 (ref 1.2–2.2)
Albumin: 3.9 g/dL (ref 3.8–4.8)
Alkaline Phosphatase: 48 IU/L (ref 48–121)
BUN/Creatinine Ratio: 7 — ABNORMAL LOW (ref 9–23)
BUN: 4 mg/dL — ABNORMAL LOW (ref 6–20)
Bilirubin Total: 0.3 mg/dL (ref 0.0–1.2)
CO2: 22 mmol/L (ref 20–29)
Calcium: 9.3 mg/dL (ref 8.7–10.2)
Chloride: 105 mmol/L (ref 96–106)
Creatinine, Ser: 0.58 mg/dL (ref 0.57–1.00)
GFR calc Af Amer: 140 mL/min/{1.73_m2} (ref >59)
GFR calc non Af Amer: 121 mL/min/{1.73_m2} (ref >59)
Globulin, Total: 2.6 g/dL (ref 1.5–4.5)
Glucose: 97 mg/dL (ref 65–99)
Potassium: 3.8 mmol/L (ref 3.5–5.2)
Sodium: 138 mmol/L (ref 134–144)
Total Protein: 6.5 g/dL (ref 6.0–8.5)

## 2019-11-25 LAB — HCV INTERPRETATION

## 2019-11-25 LAB — PROTEIN / CREATININE RATIO, URINE
Creatinine, Urine: 131.1 mg/dL
Protein, Ur: 25.9 mg/dL
Protein/Creat Ratio: 198 mg/g creat (ref 0–200)

## 2019-11-25 LAB — CULTURE, OB URINE

## 2019-11-25 LAB — URINE CULTURE, OB REFLEX

## 2019-11-29 ENCOUNTER — Encounter: Payer: Self-pay | Admitting: Obstetrics

## 2019-11-29 ENCOUNTER — Encounter: Payer: Self-pay | Admitting: Nurse Practitioner

## 2019-11-29 DIAGNOSIS — R87810 Cervical high risk human papillomavirus (HPV) DNA test positive: Secondary | ICD-10-CM | POA: Insufficient documentation

## 2019-11-29 LAB — CYTOLOGY - PAP
Comment: NEGATIVE
Comment: NEGATIVE
Diagnosis: NEGATIVE
HPV 16: NEGATIVE
HPV 18 / 45: NEGATIVE
High risk HPV: POSITIVE — AB

## 2019-12-03 ENCOUNTER — Encounter (HOSPITAL_COMMUNITY): Payer: Self-pay | Admitting: Family Medicine

## 2019-12-03 ENCOUNTER — Other Ambulatory Visit: Payer: Self-pay

## 2019-12-03 ENCOUNTER — Inpatient Hospital Stay (HOSPITAL_COMMUNITY)
Admission: AD | Admit: 2019-12-03 | Discharge: 2019-12-03 | Disposition: A | Discharge status: Home / Self Care | Payer: Medicaid Other | Attending: Family Medicine | Admitting: Family Medicine

## 2019-12-03 DIAGNOSIS — R102 Pelvic and perineal pain: Secondary | ICD-10-CM | POA: Diagnosis not present

## 2019-12-03 DIAGNOSIS — Z3A15 15 weeks gestation of pregnancy: Secondary | ICD-10-CM | POA: Insufficient documentation

## 2019-12-03 DIAGNOSIS — O10012 Pre-existing essential hypertension complicating pregnancy, second trimester: Secondary | ICD-10-CM | POA: Insufficient documentation

## 2019-12-03 DIAGNOSIS — Z79899 Other long term (current) drug therapy: Secondary | ICD-10-CM | POA: Insufficient documentation

## 2019-12-03 DIAGNOSIS — R110 Nausea: Secondary | ICD-10-CM | POA: Diagnosis not present

## 2019-12-03 DIAGNOSIS — O26892 Other specified pregnancy related conditions, second trimester: Secondary | ICD-10-CM | POA: Insufficient documentation

## 2019-12-03 DIAGNOSIS — Z7982 Long term (current) use of aspirin: Secondary | ICD-10-CM | POA: Insufficient documentation

## 2019-12-03 DIAGNOSIS — E162 Hypoglycemia, unspecified: Secondary | ICD-10-CM | POA: Diagnosis not present

## 2019-12-03 DIAGNOSIS — R42 Dizziness and giddiness: Secondary | ICD-10-CM | POA: Diagnosis not present

## 2019-12-03 DIAGNOSIS — N949 Unspecified condition associated with female genital organs and menstrual cycle: Secondary | ICD-10-CM

## 2019-12-03 DIAGNOSIS — O10912 Unspecified pre-existing hypertension complicating pregnancy, second trimester: Secondary | ICD-10-CM

## 2019-12-03 LAB — GLUCOSE, CAPILLARY
Glucose-Capillary: 69 mg/dL — ABNORMAL LOW (ref 70–99)
Glucose-Capillary: 129 mg/dL — ABNORMAL HIGH (ref 70–99)

## 2019-12-03 LAB — URINALYSIS, ROUTINE W REFLEX MICROSCOPIC
Bilirubin Urine: NEGATIVE
Glucose, UA: NEGATIVE mg/dL
Hgb urine dipstick: NEGATIVE
Ketones, ur: NEGATIVE mg/dL
Leukocytes,Ua: NEGATIVE
Nitrite: NEGATIVE
Protein, ur: NEGATIVE mg/dL
Specific Gravity, Urine: 1.011 (ref 1.005–1.030)
pH: 8 (ref 5.0–8.0)

## 2019-12-03 NOTE — Discharge Instructions (Signed)
Hypoglycemia Hypoglycemia occurs when the level of sugar (glucose) in the blood is too low. Hypoglycemia can happen in people who do or do not have diabetes. It can develop quickly, and it can be a medical emergency. For most people with diabetes, a blood glucose level below 70 mg/dL (3.9 mmol/L) is considered hypoglycemia. Glucose is a type of sugar that provides the body's main source of energy. Certain hormones (insulin and glucagon) control the level of glucose in the blood. Insulin lowers blood glucose, and glucagon raises blood glucose. Hypoglycemia can result from having too much insulin in the bloodstream, or from not eating enough food that contains glucose. You may also have reactive hypoglycemia, which happens within 4 hours after eating a meal. What are the causes? Hypoglycemia occurs most often in people who have diabetes and may be caused by:  Diabetes medicine.  Not eating enough, or not eating often enough.  Increased physical activity.  Drinking alcohol on an empty stomach. If you do not have diabetes, hypoglycemia may be caused by:  A tumor in the pancreas.  Not eating enough, or not eating for long periods at a time (fasting).  A severe infection or illness.  Certain medicines. What increases the risk? Hypoglycemia is more likely to develop in:  People who have diabetes and take medicines to lower blood glucose.  People who abuse alcohol.  People who have a severe illness. What are the signs or symptoms? Mild symptoms Mild hypoglycemia may not cause any symptoms. If you do have symptoms, they may include:  Hunger.  Anxiety.  Sweating and feeling clammy.  Dizziness or feeling light-headed.  Sleepiness.  Nausea.  Increased heart rate.  Headache.  Blurry vision.  Irritability.  Tingling or numbness around the mouth, lips, or tongue.  A change in coordination.  Restless sleep. Moderate symptoms Moderate hypoglycemia can cause:  Mental  confusion and poor judgment.  Behavior changes.  Weakness.  Irregular heartbeat. Severe symptoms Severe hypoglycemia is a medical emergency. It can cause:  Fainting.  Seizures.  Loss of consciousness (coma).  Death. How is this diagnosed? Hypoglycemia is diagnosed with a blood test to measure your blood glucose level. This blood test is done while you are having symptoms. Your health care provider may also do a physical exam and review your medical history. How is this treated? This condition can often be treated by immediately eating or drinking something that contains sugar, such as:  Fruit juice, 4-6 oz (120-150 mL).  Regular soda (not diet soda), 4-6 oz (120-150 mL).  Low-fat milk, 4 oz (120 mL).  Several pieces of hard candy.  Sugar or honey, 1 Tbsp (15 mL). Treating hypoglycemia if you have diabetes If you are alert and able to swallow safely, follow the 15:15 rule:  Take 15 grams of a rapid-acting carbohydrate. Talk with your health care provider about how much you should take.  Rapid-acting options include: ? Glucose pills (take 15 grams). ? 6-8 pieces of hard candy. ? 4-6 oz (120-150 mL) of fruit juice. ? 4-6 oz (120-150 mL) of regular (not diet) soda. ? 1 Tbsp (15 mL) honey or sugar.  Check your blood glucose 15 minutes after you take the carbohydrate.  If the repeat blood glucose level is still at or below 70 mg/dL (3.9 mmol/L), take 15 grams of a carbohydrate again.  If your blood glucose level does not increase above 70 mg/dL (3.9 mmol/L) after 3 tries, seek emergency medical care.  After your blood glucose level returns   to normal, eat a meal or a snack within 1 hour.  Treating severe hypoglycemia Severe hypoglycemia is when your blood glucose level is at or below 54 mg/dL (3 mmol/L). Severe hypoglycemia is a medical emergency. Get medical help right away. If you have severe hypoglycemia and you cannot eat or drink, you may need an injection of  glucagon. A family member or close friend should learn how to check your blood glucose and how to give you a glucagon injection. Ask your health care provider if you need to have an emergency glucagon injection kit available. Severe hypoglycemia may need to be treated in a hospital. The treatment may include getting glucose through an IV. You may also need treatment for the cause of your hypoglycemia. Follow these instructions at home:  General instructions  Take over-the-counter and prescription medicines only as told by your health care provider.  Monitor your blood glucose as told by your health care provider.  Limit alcohol intake to no more than 1 drink a day for nonpregnant women and 2 drinks a day for men. One drink equals 12 oz of beer (355 mL), 5 oz of wine (148 mL), or 1 oz of hard liquor (44 mL).  Keep all follow-up visits as told by your health care provider. This is important. If you have diabetes:  Always have a rapid-acting carbohydrate snack with you to treat low blood glucose.  Follow your diabetes management plan as directed. Make sure you: ? Know the symptoms of hypoglycemia. It is important to treat it right away to prevent it from becoming severe. ? Take your medicines as directed. ? Follow your exercise plan. ? Follow your meal plan. Eat on time, and do not skip meals. ? Check your blood glucose as often as directed. Always check before and after exercise. ? Follow your sick day plan whenever you cannot eat or drink normally. Make this plan in advance with your health care provider.  Share your diabetes management plan with people in your workplace, school, and household.  Check your urine for ketones when you are ill and as told by your health care provider.  Carry a medical alert card or wear medical alert jewelry. Contact a health care provider if:  You have problems keeping your blood glucose in your target range.  You have frequent episodes of  hypoglycemia. Get help right away if:  You continue to have hypoglycemia symptoms after eating or drinking something containing glucose.  Your blood glucose is at or below 54 mg/dL (3 mmol/L).  You have a seizure.  You faint. These symptoms may represent a serious problem that is an emergency. Do not wait to see if the symptoms will go away. Get medical help right away. Call your local emergency services (911 in the [place] Summary  Hypoglycemia occurs when the level of sugar (glucose) in the blood is too low.  Hypoglycemia can happen in people who do or do not have diabetes. It can develop quickly, and it can be a medical emergency.  Make sure you know the symptoms of hypoglycemia and how to treat it.  Always have a rapid-acting carbohydrate snack with you to treat low blood sugar. This information is not intended to replace advice given to you by your health care provider. Make sure you discuss any questions you have with your health care provider. Document Revised: 09/14/2017 Document Reviewed: 04/27/2015 Elsevier Patient Education  2020 Elsevier Inc.  

## 2019-12-03 NOTE — MAU Note (Signed)
Megan Mcneil is a 34 y.o. at [redacted]w[redacted]d here in MAU reporting: pt state she has had high B/P the last few days with dizziness. abd pain and states she hasn't felt the baby move today

## 2019-12-03 NOTE — MAU Provider Note (Signed)
History     CSN: [id number]  Arrival date and time: 12/03/19 2009   First Provider Initiated Contact with Patient 12/03/19 2055      Chief Complaint  Patient presents with  . Hypertension  . Abdominal Pain   Megan Mcneil is a 34 y.o. [id number] at [redacted]w[redacted]d who receives care at [facility].  She presents today for Hypertension.  She states she had an elevated blood pressure of 150s/100s on Monday and contacted her office and was instructed to monitor it.  Patient also reports that she had an elevated blood pressure today of 140/83.  Patient states she was also feeling light headed and despite laying down it continued.  Patient reports this was about 3 hours ago.  Patient reports she has been "munching all day and hasn't really had an appetite because I am nauseous."  Patient reports she is taking Diclegis which helps with the vomiting, but continues to have nausea.  Patient reports she took her labetalol at 10am and has not taken her second dose.  Patient states she has "achy pain" in her abdominal a few times a day.  She states it lasts about 10-15 seconds and describes it as "an ache and some tightening."    Soup and crackers Chicken Salad   OB History    Gravida  6   Para  3   Term      Preterm  3   AB  2   Living  3     SAB  2   TAB      Ectopic      Multiple      Live Births  3           Past Medical History:  Diagnosis Date  . Endometriosis   . Headache   . Hypertension   . IBS (irritable bowel syndrome)    w/ Diarrhea  . Pancreatitis   . PCOS (polycystic ovarian syndrome)   . Tachycardia     Past Surgical History:  Procedure Laterality Date  . APPENDECTOMY    . CESAREAN SECTION    . CHOLECYSTECTOMY    . LAPAROSCOPIC GASTRIC SLEEVE RESECTION      History reviewed. No pertinent family history.  Social History   Tobacco Use  . Smoking status: Never Smoker  . Smokeless tobacco: Never Used  Vaping Use  . Vaping Use: Never used  Substance  Use Topics  . Alcohol use: Not Currently  . Drug use: Never    Allergies:  Allergies  Allergen Reactions  . Latex Hives  . Shellfish Allergy Nausea And Vomiting    Medications Prior to Admission  Medication Sig Dispense Refill Last Dose  . aspirin EC 81 MG tablet Take 1 tablet (81 mg total) by mouth daily. Swallow whole. 30 tablet 6 12/03/2019 at Unknown time  . Blood Pressure Monitor KIT 1 Device by Does not apply route once a week. To be monitored Regularly at home. 1 kit 0 12/03/2019 at Unknown time  . Doxylamine-Pyridoxine (DICLEGIS) 10-10 MG TBEC Take 2 tablets at bedtime and one in the morning and one in the afternoon as needed for nausea. 60 tablet 2 12/03/2019 at Unknown time  . folic acid (FOLVITE) 644 MCG tablet Take 400 mcg by mouth daily.   12/03/2019 at Unknown time  . labetalol (NORMODYNE) 100 MG tablet Take 1 tablet (100 mg total) by mouth 2 (two) times daily. 60 tablet 2 12/03/2019 at Unknown time  . ondansetron (ZOFRAN ODT) 4 MG  disintegrating tablet Take 1 tablet (4 mg total) by mouth every 8 (eight) hours as needed for nausea or vomiting. 20 tablet 0 Past Month at Unknown time  . Prenatal Vit-Fe Fumarate-FA (PRENATAL MULTIVITAMIN) TABS tablet Take 1 tablet by mouth daily at 12 noon.   12/03/2019 at Unknown time  . METHYLDOPA PO Take 1 tablet by mouth every morning.       Review of Systems  Constitutional: Negative for chills and fever.  Respiratory: Negative for cough and shortness of breath.   Gastrointestinal: Positive for abdominal pain and nausea. Negative for constipation, diarrhea and vomiting.  Genitourinary: Negative for difficulty urinating, dysuria, pelvic pain, vaginal bleeding and vaginal discharge.  Musculoskeletal: Negative for back pain.  Neurological: Positive for dizziness and light-headedness. Negative for headaches (H/o Chronic Migraine. Last one 2 weeks ago. ).   Physical Exam   Blood pressure 121/76, pulse 88, temperature 98.5 F (36.9 C),  temperature source Oral, resp. rate 18, weight 103.1 kg, last menstrual period 08/18/2019, SpO2 98 %.  Physical Exam Vitals reviewed.  Constitutional:      Appearance: She is well-developed. She is obese.  Cardiovascular:     Rate and Rhythm: Normal rate and regular rhythm.     Heart sounds: Normal heart sounds.  Pulmonary:     Effort: Pulmonary effort is normal. No respiratory distress.     Breath sounds: Normal breath sounds.  Abdominal:     General: Bowel sounds are normal.     Palpations: Abdomen is soft.     Tenderness: There is no abdominal tenderness.  Skin:    General: Skin is warm and dry.  Neurological:     Mental Status: She is alert.     MAU Course  Procedures Results for orders placed or performed during the hospital encounter of 12/03/19 (from the past 24 hour(s))  Urinalysis, Routine w reflex microscopic Urine, Random     Status: Abnormal   Collection Time: 12/03/19  9:11 PM  Result Value Ref Range   Color, Urine YELLOW YELLOW   APPearance CLOUDY (A) CLEAR   Specific Gravity, Urine 1.011 1.005 - 1.030   pH 8.0 5.0 - 8.0   Glucose, UA NEGATIVE NEGATIVE mg/dL   Hgb urine dipstick NEGATIVE NEGATIVE   Bilirubin Urine NEGATIVE NEGATIVE   Ketones, ur NEGATIVE NEGATIVE mg/dL   Protein, ur NEGATIVE NEGATIVE mg/dL   Nitrite NEGATIVE NEGATIVE   Leukocytes,Ua NEGATIVE NEGATIVE  Glucose, capillary     Status: Abnormal   Collection Time: 12/03/19  9:17 PM  Result Value Ref Range   Glucose-Capillary 69 (L) 70 - 99 mg/dL  Glucose, capillary     Status: Abnormal   Collection Time: 12/03/19  9:59 PM  Result Value Ref Range   Glucose-Capillary 129 (H) 70 - 99 mg/dL    MDM Education Limited Exam Labs: UA, CBG Assessment and Plan  34 year old, [id number]  SIUP at 15.2weeks Dizziness CHTN Round Ligament Pain Nausea  -Educated on dizziness and how it can be related to improper nutritional intake, medications, blood pressure, or pregnancy. -Discussed high protein  diet to maintain blood sugar levels. Also discussed H2O intake should be equivalent of weight in kg which would be ~103 oz daily.  -Will collect CBG and send UA -Discussed abdominal pain and informed likely round ligament pain.  Reassurance given.  -Discussed CHTN state and management of medications.  Further instructed to take blood pressures once weekly and not 3x/day.  Further instructed to take blood pressures when other symptoms  of concern arise I.e dizziness, headaches, and visual disturbances. -Patient offered and declines antiemetics. -Will await results and reassess.  Maryann Conners 12/03/2019, 8:55 PM   Reassessment (10:12 PM) -Initial CBG 69 and patient given crackers, peanut butter. -Repeat CBG 30 minutes later 129. -Discussed hypoglycemia and how it contributes to symptoms. -Encouraged increased protein and overall intake. -Will give OOW excuse for tonight. -Patient to keep appts as scheduled. -Patient without questions or concerns. -Encouraged to call or return to MAU if symptoms worsen or with the onset of new symptoms. -Discharged to home in stable condition.  Maryann Conners MSN, CNM Advanced Practice Provider, Center for [employer]

## 2019-12-06 ENCOUNTER — Encounter: Payer: Self-pay | Admitting: Obstetrics

## 2019-12-07 ENCOUNTER — Encounter: Payer: Self-pay | Admitting: Obstetrics

## 2019-12-21 ENCOUNTER — Encounter: Payer: Medicaid Other | Admitting: Obstetrics and Gynecology

## 2020-01-02 ENCOUNTER — Ambulatory Visit (INDEPENDENT_AMBULATORY_CARE_PROVIDER_SITE_OTHER): Payer: Medicaid Other | Admitting: Obstetrics and Gynecology

## 2020-01-02 ENCOUNTER — Other Ambulatory Visit: Payer: Self-pay

## 2020-01-02 ENCOUNTER — Encounter: Payer: Self-pay | Admitting: Obstetrics and Gynecology

## 2020-01-02 VITALS — BP 115/76 | HR 92 | Wt 230.4 lb

## 2020-01-02 DIAGNOSIS — O09899 Supervision of other high risk pregnancies, unspecified trimester: Secondary | ICD-10-CM

## 2020-01-02 DIAGNOSIS — Z3A19 19 weeks gestation of pregnancy: Secondary | ICD-10-CM

## 2020-01-02 DIAGNOSIS — O09892 Supervision of other high risk pregnancies, second trimester: Secondary | ICD-10-CM

## 2020-01-02 DIAGNOSIS — O10919 Unspecified pre-existing hypertension complicating pregnancy, unspecified trimester: Secondary | ICD-10-CM

## 2020-01-02 DIAGNOSIS — O10912 Unspecified pre-existing hypertension complicating pregnancy, second trimester: Secondary | ICD-10-CM

## 2020-01-02 DIAGNOSIS — O09292 Supervision of pregnancy with other poor reproductive or obstetric history, second trimester: Secondary | ICD-10-CM | POA: Diagnosis not present

## 2020-01-02 DIAGNOSIS — O34219 Maternal care for unspecified type scar from previous cesarean delivery: Secondary | ICD-10-CM

## 2020-01-02 DIAGNOSIS — O09299 Supervision of pregnancy with other poor reproductive or obstetric history, unspecified trimester: Secondary | ICD-10-CM | POA: Insufficient documentation

## 2020-01-02 DIAGNOSIS — Z3480 Encounter for supervision of other normal pregnancy, unspecified trimester: Secondary | ICD-10-CM

## 2020-01-02 NOTE — Progress Notes (Signed)
   PRENATAL VISIT NOTE  Subjective:  Megan Mcneil is a 34 y.o. [phone] at [redacted]w[redacted]d being seen today for ongoing prenatal care.  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  Patient reports no complaints.  Contractions: Irritability. Vag. Bleeding: None.  Movement: Present. Denies leaking of fluid.   The following portions of the patient's history were reviewed and updated as appropriate: allergies, current medications, past family history, past medical history, past social history, past surgical history and problem list.   Objective:   Vitals:   01/02/20 1326  BP: 115/76  Pulse: 92  Weight: 230 lb 6.4 oz (104.5 kg)    Fetal Status: Fetal Heart Rate (bpm): 150   Movement: Present     General:  Alert, oriented and cooperative. Patient is in no acute distress.  Skin: Skin is warm and dry. No rash noted.   Cardiovascular: Normal heart rate noted  Respiratory: Normal respiratory effort, no problems with respiration noted  Abdomen: Soft, gravid, appropriate for gestational age.  Pain/Pressure: Present     Pelvic: Cervical exam deferred        Extremities: Normal range of motion.  Edema: None  Mental Status: Normal mood and affect. Normal behavior. Normal judgment and thought content.   Assessment and Plan:  Pregnancy: [id number] at [redacted]w[redacted]d 1. Supervision of other normal pregnancy, antepartum Patient is doing well AFP today Anatomy ultrasound ordered - US MFM OB DETAIL +14 WK; Future  2. History of preterm delivery,  currently pregnant, unspecified trimester Indicated pre-term deliveries  3. Previous cesarean section complicating pregnancy Will be scheduled for repeat  4. Chronic hypertension affecting pregnancy Continue Labetalol and ASA  5. History of pre-eclampsia in prior pregnancy, currently pregnant   Preterm labor symptoms and general obstetric precautions including but not limited to vaginal bleeding, contractions, leaking of fluid and fetal movement were reviewed in detail with the patient. Please refer to After Visit Summary for other counseling recommendations.   Return in about 4 weeks (around 01/30/2020) for in person, ROB, High risk.  No future appointments.  Mora Bellman, MD

## 2020-01-04 LAB — AFP, SERUM, OPEN SPINA BIFIDA
AFP MoM: 1.11
AFP Value: 45.1 ng/mL
Gest. Age on Collection Date: 19.4 weeks
Maternal Age At EDD: 33.6 yr
OSBR Risk 1 IN: 8647
Test Results:: NEGATIVE
Weight: 230 [lb_av]

## 2020-01-06 ENCOUNTER — Other Ambulatory Visit: Payer: Self-pay | Admitting: *Deleted

## 2020-01-06 ENCOUNTER — Ambulatory Visit: Payer: Medicaid Other

## 2020-01-06 ENCOUNTER — Ambulatory Visit: Payer: Medicaid Other | Admitting: *Deleted

## 2020-01-06 ENCOUNTER — Other Ambulatory Visit: Payer: Self-pay

## 2020-01-06 ENCOUNTER — Ambulatory Visit: Payer: Medicaid Other | Attending: Obstetrics and Gynecology

## 2020-01-06 DIAGNOSIS — O34219 Maternal care for unspecified type scar from previous cesarean delivery: Secondary | ICD-10-CM

## 2020-01-06 DIAGNOSIS — O09299 Supervision of pregnancy with other poor reproductive or obstetric history, unspecified trimester: Secondary | ICD-10-CM

## 2020-01-06 DIAGNOSIS — O10919 Unspecified pre-existing hypertension complicating pregnancy, unspecified trimester: Secondary | ICD-10-CM

## 2020-01-06 DIAGNOSIS — O358XX0 Maternal care for other (suspected) fetal abnormality and damage, not applicable or unspecified: Secondary | ICD-10-CM | POA: Insufficient documentation

## 2020-01-06 DIAGNOSIS — Z3480 Encounter for supervision of other normal pregnancy, unspecified trimester: Secondary | ICD-10-CM | POA: Insufficient documentation

## 2020-01-06 DIAGNOSIS — IMO0002 Reserved for concepts with insufficient information to code with codable children: Secondary | ICD-10-CM

## 2020-01-07 LAB — TOXOPLASMA GONDII ANTIBODY, IGM: Toxoplasma Antibody- IgM: <3 AU/mL (ref 0.0–7.9)

## 2020-01-07 LAB — INFECT DISEASE AB IGM REFLEX 1

## 2020-01-07 LAB — CMV IGM: CMV IgM Ser EIA-aCnc: <30 AU/mL (ref 0.0–29.9)

## 2020-01-07 LAB — TOXOPLASMA GONDII ANTIBODY, IGG: Toxoplasma IgG Ratio: <3 IU/mL (ref 0.0–7.1)

## 2020-01-07 LAB — CMV ANTIBODY, IGG (EIA): CMV Ab - IgG: <0.6 U/mL (ref 0.00–0.59)

## 2020-01-09 ENCOUNTER — Telehealth: Payer: Self-pay | Admitting: *Deleted

## 2020-01-30 ENCOUNTER — Other Ambulatory Visit: Payer: Self-pay

## 2020-01-30 ENCOUNTER — Encounter: Payer: Self-pay | Admitting: Obstetrics and Gynecology

## 2020-01-30 ENCOUNTER — Ambulatory Visit (INDEPENDENT_AMBULATORY_CARE_PROVIDER_SITE_OTHER): Payer: Medicaid Other | Admitting: Obstetrics and Gynecology

## 2020-01-30 VITALS — BP 128/86 | HR 99 | Wt 232.2 lb

## 2020-01-30 DIAGNOSIS — O10912 Unspecified pre-existing hypertension complicating pregnancy, second trimester: Secondary | ICD-10-CM | POA: Diagnosis not present

## 2020-01-30 DIAGNOSIS — O0992 Supervision of high risk pregnancy, unspecified, second trimester: Secondary | ICD-10-CM | POA: Diagnosis not present

## 2020-01-30 DIAGNOSIS — O09299 Supervision of pregnancy with other poor reproductive or obstetric history, unspecified trimester: Secondary | ICD-10-CM

## 2020-01-30 DIAGNOSIS — O09892 Supervision of other high risk pregnancies, second trimester: Secondary | ICD-10-CM | POA: Diagnosis not present

## 2020-01-30 DIAGNOSIS — O09899 Supervision of other high risk pregnancies, unspecified trimester: Secondary | ICD-10-CM

## 2020-01-30 DIAGNOSIS — Z3A23 23 weeks gestation of pregnancy: Secondary | ICD-10-CM | POA: Diagnosis not present

## 2020-01-30 DIAGNOSIS — O34219 Maternal care for unspecified type scar from previous cesarean delivery: Secondary | ICD-10-CM

## 2020-01-30 DIAGNOSIS — Z3480 Encounter for supervision of other normal pregnancy, unspecified trimester: Secondary | ICD-10-CM

## 2020-01-30 DIAGNOSIS — O10919 Unspecified pre-existing hypertension complicating pregnancy, unspecified trimester: Secondary | ICD-10-CM

## 2020-01-30 DIAGNOSIS — O09292 Supervision of pregnancy with other poor reproductive or obstetric history, second trimester: Secondary | ICD-10-CM

## 2020-01-30 NOTE — Progress Notes (Signed)
Pt presents for ROB reports not feeling well yesterday d/t elevated BP.  Pt also reports BH's are more frequent now, about 2 every hour everyday. She said she's worried because of her hx of PTL.

## 2020-01-30 NOTE — Progress Notes (Signed)
   PRENATAL VISIT NOTE  Subjective:  Megan Mcneil is a 34 y.o. [phone] at [redacted]w[redacted]d being seen today for ongoing prenatal care.  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  Patient reports increased contractions the last few days, they are happening a few times a day, 2x/hr.  Some are quite painful, she quit work because she felt she needed to rest. The contractions got better after she was at home resting again. Denies leaking, bleeding, discharge. Drinking plenty water. Contractions: Irritability. Vag. Bleeding: None.  Movement: Present. Denies leaking of fluid.   Checked BP at home and got 150/90 last night other day, improved after resting.  The following portions of the patient's history were reviewed and updated as appropriate: allergies, current medications, past family history, past medical history, past social history, past surgical history and problem list.   Objective:   Vitals:   01/30/20 1038  BP: 128/86  Pulse: 99  Weight: 232 lb 3.2 oz (105.3 kg)    Fetal Status: Fetal Heart Rate (bpm): 150   Movement: Present     General:  Alert, oriented and cooperative. Patient is in no acute distress.  Skin: Skin is warm and dry. No rash noted.   Cardiovascular: Normal heart rate noted  Respiratory: Normal respiratory effort, no problems with respiration noted  Abdomen: Soft, gravid, appropriate for gestational age.  Pain/Pressure: Present     Pelvic: Cervical exam deferred        Extremities: Normal range of motion.   Edema: Trace  Mental Status: Normal mood and affect. Normal behavior. Normal judgment and thought content.   Assessment and Plan:  Pregnancy: [id number] at [redacted]w[redacted]d  1. [redacted] weeks gestation of pregnancy  2. Encounter for supervision of high risk pregnancy in second trimester, antepartum  3. Chronic hypertension affecting pregnancy Cont labetalol 100 mg BID Cont baby aspirin  4. History of preterm delivery, currently pregnant, unspecified trimester  5. History of pre-eclampsia in prior pregnancy, currently pregnant Will get labs today given elevated BP last night and not feeling well  6. Previous cesarean section complicating pregnancy For RCS  7. Supervision of other normal pregnancy, antepartum Considering BTL  Preterm labor symptoms and general obstetric precautions including but not limited to vaginal bleeding, contractions, leaking of fluid and fetal movement were reviewed in detail with the patient. Please refer to After Visit Summary for other counseling recommendations.   Return in about 2 weeks (around 02/13/2020) for in person, high OB.  Future Appointments  Date Time Provider [place]  02/06/2020  9:45 AM [facility] NURSE [facility]  02/06/2020 10:00 AM [facility] US1 [facility] [place]    Sloan Leiter, MD

## 2020-01-31 LAB — COMPREHENSIVE METABOLIC PANEL
ALT: 8 IU/L (ref 0–32)
AST: 7 IU/L (ref 0–40)
Albumin/Globulin Ratio: 1.1 — ABNORMAL LOW (ref 1.2–2.2)
Albumin: 3.2 g/dL — ABNORMAL LOW (ref 3.8–4.8)
Alkaline Phosphatase: 67 IU/L (ref 44–121)
BUN/Creatinine Ratio: 8 — ABNORMAL LOW (ref 9–23)
BUN: 5 mg/dL — ABNORMAL LOW (ref 6–20)
Bilirubin Total: 0.2 mg/dL (ref 0.0–1.2)
CO2: 21 mmol/L (ref 20–29)
Calcium: 9.6 mg/dL (ref 8.7–10.2)
Chloride: 105 mmol/L (ref 96–106)
Creatinine, Ser: 0.65 mg/dL (ref 0.57–1.00)
GFR calc Af Amer: 135 mL/min/{1.73_m2} (ref >59)
GFR calc non Af Amer: 117 mL/min/{1.73_m2} (ref >59)
Globulin, Total: 3 g/dL (ref 1.5–4.5)
Glucose: 100 mg/dL — ABNORMAL HIGH (ref 65–99)
Potassium: 3.8 mmol/L (ref 3.5–5.2)
Sodium: 141 mmol/L (ref 134–144)
Total Protein: 6.2 g/dL (ref 6.0–8.5)

## 2020-01-31 LAB — CBC
Hematocrit: 35.5 % (ref 34.0–46.6)
Hemoglobin: 12.4 g/dL (ref 11.1–15.9)
MCH: 31.1 pg (ref 26.6–33.0)
MCHC: 34.9 g/dL (ref 31.5–35.7)
MCV: 89 fL (ref 79–97)
Platelets: 236 10*3/uL (ref 150–450)
RBC: 3.99 x10E6/uL (ref 3.77–5.28)
RDW: 12.3 % (ref 11.7–15.4)
WBC: 12 10*3/uL — ABNORMAL HIGH (ref 3.4–10.8)

## 2020-01-31 LAB — PROTEIN / CREATININE RATIO, URINE
Creatinine, Urine: 87.4 mg/dL
Protein, Ur: 11.9 mg/dL
Protein/Creat Ratio: 136 mg/g creat (ref 0–200)

## 2020-02-03 ENCOUNTER — Inpatient Hospital Stay (HOSPITAL_COMMUNITY)
Admission: AD | Admit: 2020-02-03 | Discharge: 2020-02-03 | Disposition: A | Discharge status: Home / Self Care | Payer: Medicaid Other | Attending: Obstetrics & Gynecology | Admitting: Obstetrics & Gynecology

## 2020-02-03 ENCOUNTER — Encounter (HOSPITAL_COMMUNITY): Payer: Self-pay | Admitting: Obstetrics & Gynecology

## 2020-02-03 ENCOUNTER — Other Ambulatory Visit: Payer: Self-pay

## 2020-02-03 DIAGNOSIS — Z87891 Personal history of nicotine dependence: Secondary | ICD-10-CM | POA: Insufficient documentation

## 2020-02-03 DIAGNOSIS — Z9884 Bariatric surgery status: Secondary | ICD-10-CM | POA: Diagnosis not present

## 2020-02-03 DIAGNOSIS — Z8249 Family history of ischemic heart disease and other diseases of the circulatory system: Secondary | ICD-10-CM | POA: Insufficient documentation

## 2020-02-03 DIAGNOSIS — Z3A26 26 weeks gestation of pregnancy: Secondary | ICD-10-CM | POA: Diagnosis not present

## 2020-02-03 DIAGNOSIS — Z8759 Personal history of other complications of pregnancy, childbirth and the puerperium: Secondary | ICD-10-CM | POA: Diagnosis not present

## 2020-02-03 DIAGNOSIS — Z3A24 24 weeks gestation of pregnancy: Secondary | ICD-10-CM

## 2020-02-03 DIAGNOSIS — O10912 Unspecified pre-existing hypertension complicating pregnancy, second trimester: Secondary | ICD-10-CM | POA: Insufficient documentation

## 2020-02-03 DIAGNOSIS — K219 Gastro-esophageal reflux disease without esophagitis: Secondary | ICD-10-CM | POA: Insufficient documentation

## 2020-02-03 DIAGNOSIS — O09299 Supervision of pregnancy with other poor reproductive or obstetric history, unspecified trimester: Secondary | ICD-10-CM

## 2020-02-03 DIAGNOSIS — Z91040 Latex allergy status: Secondary | ICD-10-CM | POA: Insufficient documentation

## 2020-02-03 DIAGNOSIS — O99612 Diseases of the digestive system complicating pregnancy, second trimester: Secondary | ICD-10-CM | POA: Diagnosis not present

## 2020-02-03 DIAGNOSIS — O10919 Unspecified pre-existing hypertension complicating pregnancy, unspecified trimester: Secondary | ICD-10-CM

## 2020-02-03 LAB — CBC
HCT: 33.4 % — ABNORMAL LOW (ref 36.0–46.0)
Hemoglobin: 11.1 g/dL — ABNORMAL LOW (ref 12.0–15.0)
MCH: 30.2 pg (ref 26.0–34.0)
MCHC: 33.2 g/dL (ref 30.0–36.0)
MCV: 91 fL (ref 80.0–100.0)
Platelets: 206 10*3/uL (ref 150–400)
RBC: 3.67 MIL/uL — ABNORMAL LOW (ref 3.87–5.11)
RDW: 12.4 % (ref 11.5–15.5)
WBC: 12.1 10*3/uL — ABNORMAL HIGH (ref 4.0–10.5)
nRBC: 0 % (ref 0.0–0.2)

## 2020-02-03 LAB — PROTEIN / CREATININE RATIO, URINE
Creatinine, Urine: 179.07 mg/dL
Protein Creatinine Ratio: 0.08 mg/mg{Cre} (ref 0.00–0.15)
Total Protein, Urine: 15 mg/dL

## 2020-02-03 LAB — URINALYSIS, ROUTINE W REFLEX MICROSCOPIC
Bilirubin Urine: NEGATIVE
Glucose, UA: NEGATIVE mg/dL
Hgb urine dipstick: NEGATIVE
Ketones, ur: 5 mg/dL — AB
Nitrite: NEGATIVE
Protein, ur: NEGATIVE mg/dL
Specific Gravity, Urine: 1.017 (ref 1.005–1.030)
pH: 6 (ref 5.0–8.0)

## 2020-02-03 LAB — COMPREHENSIVE METABOLIC PANEL
ALT: 9 U/L (ref 0–44)
AST: 12 U/L — ABNORMAL LOW (ref 15–41)
Albumin: 2.6 g/dL — ABNORMAL LOW (ref 3.5–5.0)
Alkaline Phosphatase: 50 U/L (ref 38–126)
Anion gap: 11 (ref 5–15)
BUN: 5 mg/dL — ABNORMAL LOW (ref 6–20)
CO2: 20 mmol/L — ABNORMAL LOW (ref 22–32)
Calcium: 8.7 mg/dL — ABNORMAL LOW (ref 8.9–10.3)
Chloride: 107 mmol/L (ref 98–111)
Creatinine, Ser: 0.58 mg/dL (ref 0.44–1.00)
GFR, Estimated: >60 mL/min (ref >60)
Glucose, Bld: 90 mg/dL (ref 70–99)
Potassium: 3.3 mmol/L — ABNORMAL LOW (ref 3.5–5.1)
Sodium: 138 mmol/L (ref 135–145)
Total Bilirubin: 0.3 mg/dL (ref 0.3–1.2)
Total Protein: 5.5 g/dL — ABNORMAL LOW (ref 6.5–8.1)

## 2020-02-03 MED ORDER — FAMOTIDINE 40 MG PO TABS: 40.0000 mg | ORAL_TABLET | Freq: Two times a day (BID) | ORAL | 3 refills | Status: AC

## 2020-02-03 MED ORDER — PROMETHAZINE HCL 12.5 MG PO TABS: 12.5000 mg | ORAL_TABLET | Freq: Every evening | ORAL | 0 refills | Status: AC | PRN

## 2020-02-03 NOTE — MAU Note (Signed)
Lab called regarding pro/creatine ratio

## 2020-02-03 NOTE — Discharge Instructions (Signed)

## 2020-02-03 NOTE — MAU Provider Note (Signed)
Chief Complaint:  Hypertension and Foot Swelling   First Provider Initiated Contact with Patient 02/03/20 1308     HPI: Megan Mcneil is a 34 y.o. [id number] at [redacted]w[redacted]d who presents to maternity admissions reporting two episodes of HTN (145/101 and 149/97). She has chronic hypertension, had baseline labs 4 days ago, but has a history of preeclampsia in all of her previous pregnancies. Denies vaginal bleeding, leaking of fluid, decreased fetal movement, fever, falls, or recent illness.   Past Medical History:  Diagnosis Date  . Endometriosis   . Headache   . Hypertension   . IBS (irritable bowel syndrome)    w/ Diarrhea  . Pancreatitis   . PCOS (polycystic ovarian syndrome)   . Tachycardia    OB History  Gravida Para Term Preterm AB Living  $Remov'6 3   3 2 3  'ktBEbm$ SAB TAB Ectopic Multiple Live Births  2       3    # Outcome Date GA Lbr Len/2nd Weight Sex Delivery Anes PTL Lv  6 Current           5 Preterm 10/22/16 [redacted]w[redacted]d   M CS-LTranv   LIV     Complications: Pre-eclampsia  4 Preterm 10/23/13 [redacted]w[redacted]d   M CS-LTranv   LIV     Complications: Pre-eclampsia  3 Preterm 03/22/10 [redacted]w[redacted]d   M Vag-Spont   LIV     Complications: Pre-eclampsia  2 SAB           1 SAB            Past Surgical History:  Procedure Laterality Date  . APPENDECTOMY  05/2019  . CESAREAN SECTION    . CHOLECYSTECTOMY    . DILATION AND CURETTAGE OF UTERUS    . LAPAROSCOPIC GASTRIC SLEEVE RESECTION  2016   Family History  Problem Relation Age of Onset  . Healthy Mother   . Kidney disease Father        end stage kidney failure  . Heart disease Father   . Diabetes Father   . Cancer Maternal Grandmother        colon  . Diabetes Maternal Grandmother   . Heart disease Maternal Grandmother    Social History   Tobacco Use  . Smoking status: Former Smoker    Types: Cigarettes  . Smokeless tobacco: Never Used  . Tobacco comment: 2009  Vaping Use  . Vaping Use: Never used  Substance Use Topics  . Alcohol use: Not  Currently  . Drug use: Never   Allergies  Allergen Reactions  . Latex Hives  . Shellfish Allergy Nausea And Vomiting   No medications prior to admission.    I have reviewed patient's Past Medical Hx, Surgical Hx, Family Hx, Social Hx, medications and allergies.   ROS:  Review of Systems  Constitutional: Negative for fatigue and fever.  HENT: Negative for congestion and sore throat.   Eyes: Negative for visual disturbance.  Respiratory: Negative for shortness of breath.   Cardiovascular: Negative for chest pain and palpitations.       Flushing in face earlier today prior to first elevated BP  Genitourinary: Negative for flank pain.  Neurological: Negative for headaches.  All other systems reviewed and are negative.  Physical Exam   Patient Vitals for the past 24 hrs:  BP Temp Pulse Resp SpO2 Height Weight  02/03/20 1534 127/85 -- 87 -- -- -- --  02/03/20 1246 119/73 -- 86 -- -- -- --  02/03/20 1234 120/76 --  87 -- -- -- --  02/03/20 1200 130/86 98.7 F (37.1 C) 94 20 99 % $Re'5\' 4"'zmD$  (1.626 m) 237 lb 9.6 oz (107.8 kg)   Constitutional: Well-developed, well-nourished female in no acute distress.  Cardiovascular: normal rate & rhythm, no murmur Respiratory: normal effort, lung sounds clear throughout GI: Abd soft, non-tender, gravid appropriate for gestational age. Pos BS x 4 MS: Extremities nontender, no edema, normal ROM Neurologic: Alert and oriented x 4.  Pelvic exam deferred  Fetal Tracing: reactive Baseline: 150 Variability: moderate Accelerations: present Decelerations: occasional variable but appropriate for gestational age 68: relaxed   Labs: Results for orders placed or performed during the hospital encounter of 02/03/20 (from the past 24 hour(s))  Urinalysis, Routine w reflex microscopic Urine, Clean Catch     Status: Abnormal   Collection Time: 02/03/20 12:50 PM  Result Value Ref Range   Color, Urine YELLOW YELLOW   APPearance CLOUDY (A) CLEAR   Specific  Gravity, Urine 1.017 1.005 - 1.030   pH 6.0 5.0 - 8.0   Glucose, UA NEGATIVE NEGATIVE mg/dL   Hgb urine dipstick NEGATIVE NEGATIVE   Bilirubin Urine NEGATIVE NEGATIVE   Ketones, ur 5 (A) NEGATIVE mg/dL   Protein, ur NEGATIVE NEGATIVE mg/dL   Nitrite NEGATIVE NEGATIVE   Leukocytes,Ua SMALL (A) NEGATIVE   RBC / HPF 0-5 0 - 5 RBC/hpf   WBC, UA 6-10 0 - 5 WBC/hpf   Bacteria, UA FEW (A) NONE SEEN   Squamous Epithelial / LPF 11-20 0 - 5   Mucus PRESENT   Protein / creatinine ratio, urine     Status: None   Collection Time: 02/03/20  1:15 PM  Result Value Ref Range   Creatinine, Urine 179.07 mg/dL   Total Protein, Urine 15 mg/dL   Protein Creatinine Ratio 0.08 0.00 - 0.15 mg/mg[Cre]  CBC     Status: Abnormal   Collection Time: 02/03/20  1:27 PM  Result Value Ref Range   WBC 12.1 (H) 4.0 - 10.5 K/uL   RBC 3.67 (L) 3.87 - 5.11 MIL/uL   Hemoglobin 11.1 (L) 12.0 - 15.0 g/dL   HCT 33.4 (L) 36 - 46 %   MCV 91.0 80.0 - 100.0 fL   MCH 30.2 26.0 - 34.0 pg   MCHC 33.2 30.0 - 36.0 g/dL   RDW 12.4 11.5 - 15.5 %   Platelets 206 150 - 400 K/uL   nRBC 0.0 0.0 - 0.2 %  Comprehensive metabolic panel     Status: Abnormal   Collection Time: 02/03/20  1:27 PM  Result Value Ref Range   Sodium 138 135 - 145 mmol/L   Potassium 3.3 (L) 3.5 - 5.1 mmol/L   Chloride 107 98 - 111 mmol/L   CO2 20 (L) 22 - 32 mmol/L   Glucose, Bld 90 70 - 99 mg/dL   BUN 5 (L) 6 - 20 mg/dL   Creatinine, Ser 0.58 0.44 - 1.00 mg/dL   Calcium 8.7 (L) 8.9 - 10.3 mg/dL   Total Protein 5.5 (L) 6.5 - 8.1 g/dL   Albumin 2.6 (L) 3.5 - 5.0 g/dL   AST 12 (L) 15 - 41 U/L   ALT 9 0 - 44 U/L   Alkaline Phosphatase 50 38 - 126 U/L   Total Bilirubin 0.3 0.3 - 1.2 mg/dL   GFR, Estimated >60 >60 mL/min   Anion gap 11 5 - 15    Imaging:  No results found.  MAU Course: Orders Placed This Encounter  Procedures  .  Urinalysis, Routine w reflex microscopic Urine, Clean Catch  . CBC  . Comprehensive metabolic panel  . Protein /  creatinine ratio, urine  . Discharge patient   Meds ordered this encounter  Medications  . famotidine (PEPCID) 40 MG tablet    Sig: Take 1 tablet (40 mg total) by mouth 2 (two) times daily.    Dispense:  60 tablet    Refill:  3    Order Specific Question:   Supervising Provider    Answer:   Donnamae Jude [[id number]]  . promethazine (PHENERGAN) 12.5 MG tablet    Sig: Take 1 tablet (12.5 mg total) by mouth at bedtime as needed for nausea or vomiting.    Dispense:  30 tablet    Refill:  0    Order Specific Question:   Supervising Provider    Answer:   Donnamae Jude [[id number]]   MDM: All BP normal during MAU observation.  Preeclampsia labs normal CMP showed evidence of poor nutrition, possible K+ loss. Pt admitted she has been only lightly grazing throughout the day, still nauseated and vomiting at least once daily. Also having worsened reflux, sometimes keeps her up at night.  Assessment: 1. History of pre-eclampsia in prior pregnancy, currently pregnant   2. Chronic hypertension affecting pregnancy   3. Reflux in pregnancy  Plan: Discharge home in stable condition with preeclampsia precautions Phenergan and pepcid sent to pharmacy Encouraged pt to increase intake of protein and complex carbs, affirmed that protein and/or meal replacement shakes are appropriate if she cannot get meals in.    Follow-up Information    [facility]. Schedule an appointment as soon as possible for a visit in 3 day(s).   Specialty: Obstetrics and Gynecology Why: Call to make an appointment for a BP check on Monday Contact information: [address] [place] [phone]              Allergies as of 02/03/2020      Reactions   Latex Hives   Shellfish Allergy Nausea And Vomiting      Medication List    STOP taking these medications   ondansetron 4 MG disintegrating tablet Commonly known as: Zofran ODT     TAKE these  medications   aspirin EC 81 MG tablet Take 1 tablet (81 mg total) by mouth daily. Swallow whole.   Blood Pressure Monitor Kit 1 Device by Does not apply route once a week. To be monitored Regularly at home.   Doxylamine-Pyridoxine 10-10 MG Tbec Commonly known as: Diclegis Take 2 tablets at bedtime and one in the morning and one in the afternoon as needed for nausea.   famotidine 40 MG tablet Commonly known as: PEPCID Take 1 tablet (40 mg total) by mouth 2 (two) times daily.   folic acid 673 MCG tablet Commonly known as: FOLVITE Take 400 mcg by mouth daily.   labetalol 100 MG tablet Commonly known as: NORMODYNE Take 1 tablet (100 mg total) by mouth 2 (two) times daily.   prenatal multivitamin Tabs tablet Take 1 tablet by mouth daily at 12 noon.   promethazine 12.5 MG tablet Commonly known as: PHENERGAN Take 1 tablet (12.5 mg total) by mouth at bedtime as needed for nausea or vomiting.      Gaylan Gerold, CNM, MSN, [facility] 02/03/20 3:57 PM

## 2020-02-03 NOTE — MAU Note (Signed)
When she got up this morning, she was really swollen and her face was really red, so she checked her BP, it was 129/108.  Took meds, rechecked an hour later per MD instruction was 149/97. Denies HA, unsure of visual changes, denies epigastric pain. States BP has been creeping up the last couple wks.

## 2020-02-06 ENCOUNTER — Ambulatory Visit: Payer: Medicaid Other | Admitting: *Deleted

## 2020-02-06 ENCOUNTER — Other Ambulatory Visit: Payer: Self-pay

## 2020-02-06 ENCOUNTER — Encounter: Payer: Self-pay | Admitting: *Deleted

## 2020-02-06 ENCOUNTER — Other Ambulatory Visit: Payer: Self-pay | Admitting: *Deleted

## 2020-02-06 ENCOUNTER — Ambulatory Visit: Payer: Medicaid Other | Attending: Obstetrics and Gynecology

## 2020-02-06 DIAGNOSIS — O09212 Supervision of pregnancy with history of pre-term labor, second trimester: Secondary | ICD-10-CM | POA: Diagnosis not present

## 2020-02-06 DIAGNOSIS — E669 Obesity, unspecified: Secondary | ICD-10-CM

## 2020-02-06 DIAGNOSIS — O10919 Unspecified pre-existing hypertension complicating pregnancy, unspecified trimester: Secondary | ICD-10-CM | POA: Insufficient documentation

## 2020-02-06 DIAGNOSIS — O34219 Maternal care for unspecified type scar from previous cesarean delivery: Secondary | ICD-10-CM | POA: Diagnosis present

## 2020-02-06 DIAGNOSIS — O99212 Obesity complicating pregnancy, second trimester: Secondary | ICD-10-CM | POA: Diagnosis not present

## 2020-02-06 DIAGNOSIS — Z362 Encounter for other antenatal screening follow-up: Secondary | ICD-10-CM

## 2020-02-06 DIAGNOSIS — O09299 Supervision of pregnancy with other poor reproductive or obstetric history, unspecified trimester: Secondary | ICD-10-CM | POA: Insufficient documentation

## 2020-02-06 DIAGNOSIS — Z3A24 24 weeks gestation of pregnancy: Secondary | ICD-10-CM

## 2020-02-06 DIAGNOSIS — O10012 Pre-existing essential hypertension complicating pregnancy, second trimester: Secondary | ICD-10-CM

## 2020-02-16 ENCOUNTER — Encounter: Payer: Medicaid Other | Admitting: Obstetrics & Gynecology

## 2020-02-16 DIAGNOSIS — Z3A26 26 weeks gestation of pregnancy: Secondary | ICD-10-CM

## 2020-03-06 ENCOUNTER — Ambulatory Visit: Payer: Medicaid Other

## 2020-03-06 ENCOUNTER — Ambulatory Visit: Payer: Medicaid Other | Attending: Obstetrics and Gynecology

## 2020-05-10 ENCOUNTER — Telehealth (HOSPITAL_COMMUNITY): Payer: Self-pay | Admitting: *Deleted

## 2020-05-10 NOTE — Patient Instructions (Signed)
Constance Whittle  05/10/2020   Your procedure is scheduled on:  05/17/2020  Arrive at [address] at [employer] [address] on [address] at [facility]  and [employer]. You are invited to use the FREE valet parking or use the Visitor's parking deck.  Pick up the phone at the desk and dial [phone].  Call this number if you have problems the morning of surgery: [phone]  Remember:   Do not eat food:(After Midnight) Desps de medianoche.  Do not drink clear liquids: (After Midnight) Desps de medianoche.  Take these medicines the morning of surgery with A SIP OF WATER:  Take labetalol as prescribed   Do not wear jewelry, make-up or nail polish.  Do not wear lotions, powders, or perfumes. Do not wear deodorant.  Do not shave 48 hours prior to surgery.  Do not bring valuables to the hospital.  [facility] is not   responsible for any belongings or valuables brought to the hospital.  Contacts, dentures or bridgework may not be worn into surgery.  Leave suitcase in the car. After surgery it may be brought to your room.  For patients admitted to the hospital, checkout time is 11:00 AM the day of              discharge.      Please read over the following fact sheets that you were given:     Preparing for Surgery

## 2020-05-10 NOTE — Telephone Encounter (Signed)
Preadmission screen  

## 2020-05-11 ENCOUNTER — Telehealth (HOSPITAL_COMMUNITY): Payer: Self-pay | Admitting: *Deleted

## 2020-05-11 NOTE — Telephone Encounter (Signed)
Preadmission screen  

## 2020-05-14 ENCOUNTER — Encounter (HOSPITAL_COMMUNITY): Payer: Self-pay

## 2020-05-15 ENCOUNTER — Encounter (HOSPITAL_COMMUNITY)
Admission: RE | Admit: 2020-05-15 | Discharge: 2020-05-15 | Disposition: A | Discharge status: Home / Self Care | Payer: Medicaid Other | Source: Ambulatory Visit | Attending: Obstetrics & Gynecology | Admitting: Obstetrics & Gynecology

## 2020-05-15 ENCOUNTER — Other Ambulatory Visit (HOSPITAL_COMMUNITY): Payer: Medicaid Other

## 2020-05-17 ENCOUNTER — Inpatient Hospital Stay (HOSPITAL_COMMUNITY): Admission: RE | Admit: 2020-05-17 | Payer: Medicaid Other | Source: Home / Self Care | Admitting: Family Medicine

## 2020-05-17 ENCOUNTER — Encounter (HOSPITAL_COMMUNITY): Admission: RE | Payer: Self-pay | Source: Home / Self Care

## 2020-05-17 ENCOUNTER — Encounter (HOSPITAL_COMMUNITY): Payer: Self-pay | Admitting: Anesthesiology

## 2020-05-17 ENCOUNTER — Encounter (HOSPITAL_COMMUNITY): Payer: Self-pay | Admitting: Family Medicine

## 2020-05-17 SURGERY — Surgical Case
Anesthesia: Regional

## 2020-05-17 NOTE — Anesthesia Preprocedure Evaluation (Deleted)
Anesthesia Evaluation    Reviewed: Allergy & Precautions, Patient's Chart, lab work & pertinent test results, reviewed documented beta blocker date and time   History of Anesthesia Complications Negative for: history of anesthetic complications  Airway        Dental   Pulmonary former smoker,           Cardiovascular hypertension (gestational), Pt. on home beta blockers and Pt. on medications      Neuro/Psych  Headaches, negative psych ROS   GI/Hepatic Neg liver ROS, GERD  Controlled and Medicated,  Endo/Other  negative endocrine ROS  Renal/GU negative Renal ROS  negative genitourinary   Musculoskeletal negative musculoskeletal ROS (+)   Abdominal   Peds  Hematology negative hematology ROS (+)   Anesthesia Other Findings Day of surgery medications reviewed with patient.  Reproductive/Obstetrics (+) Pregnancy (Hx of C/S x1)                             Anesthesia Physical Anesthesia Plan  ASA: II  Anesthesia Plan: Spinal   Post-op Pain Management:    Induction:   PONV Risk Score and Plan: 4 or greater and Treatment may vary due to age or medical condition, Ondansetron and Dexamethasone  Airway Management Planned: Natural Airway  Additional Equipment: None  Intra-op Plan:   Post-operative Plan:   Informed Consent:   Plan Discussed with:   Anesthesia Plan Comments:         Anesthesia Quick Evaluation

## 2021-09-16 NOTE — Telephone Encounter (Signed)
No additional notes. Close encounter.

## 2022-03-19 IMAGING — US US OB COMP LESS 14 WK
1 series · 15 of 28 positions shown · non-contrast
Comparison: None.

CLINICAL DATA: 34-year-old pregnant female with spotting. LMP:
08/19/2019 corresponding to an estimated gestational age of 8 weeks,
4 days.

EXAM:
OBSTETRIC <14 WK ULTRASOUND
TECHNIQUE: Transabdominal ultrasound was performed for evaluation of the
gestation as well as the maternal uterus and adnexal regions.

[Series 1: us ob comp less 14 wk · 15 of 43 slices shown]
[im 1/43]
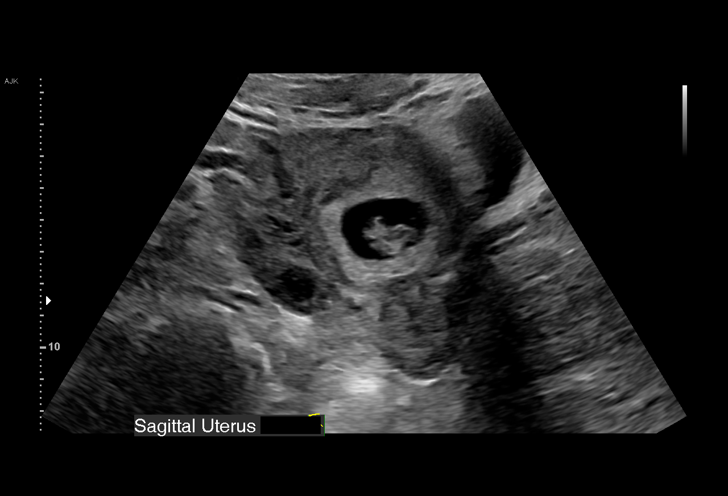
[im 4/43]
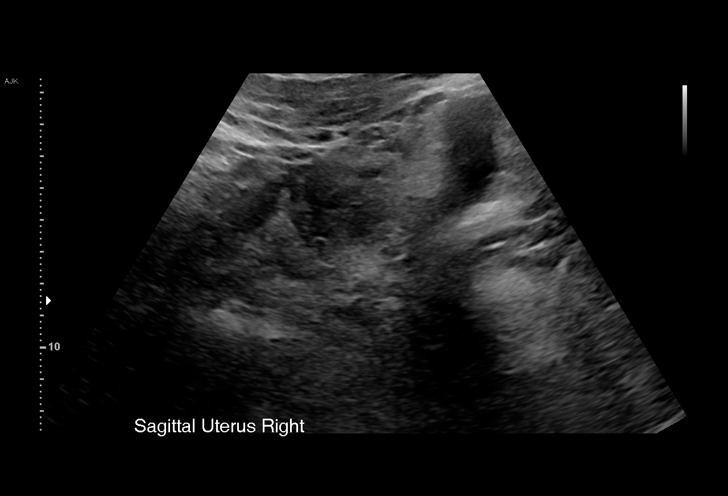
[im 7/43]
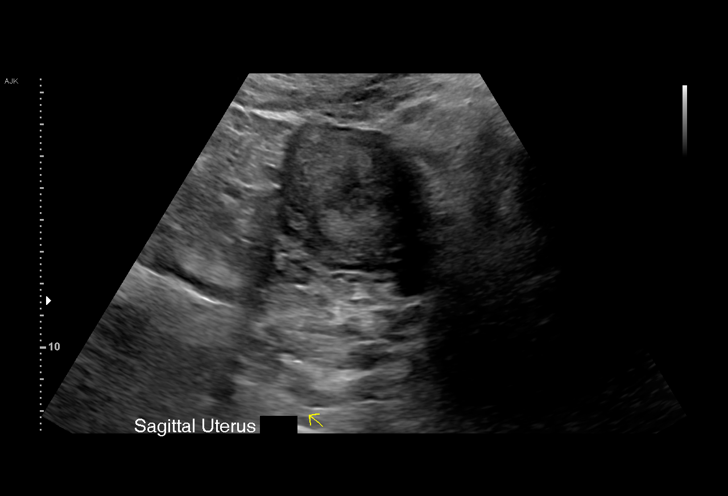
[im 10/43]
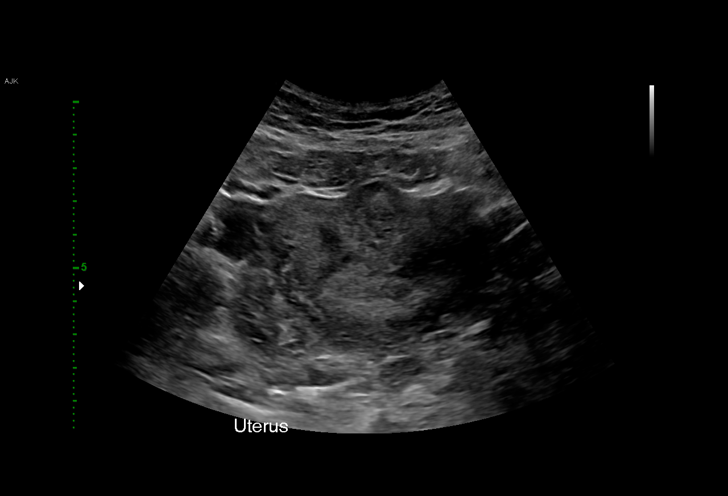
[im 13/43]
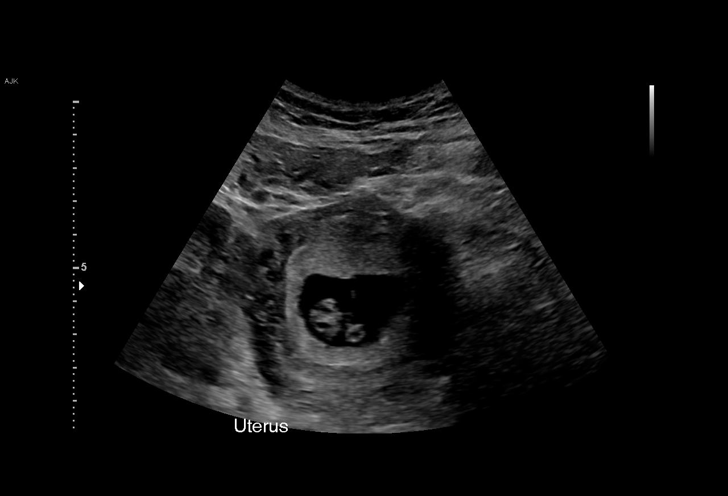
[im 16/43]
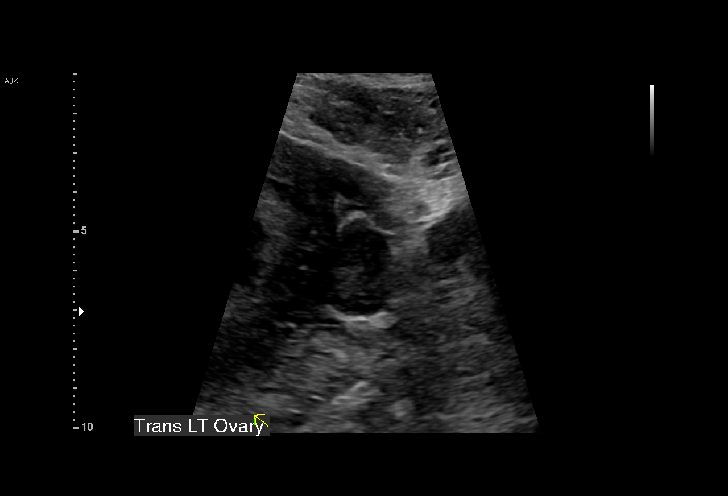
[im 19/43]
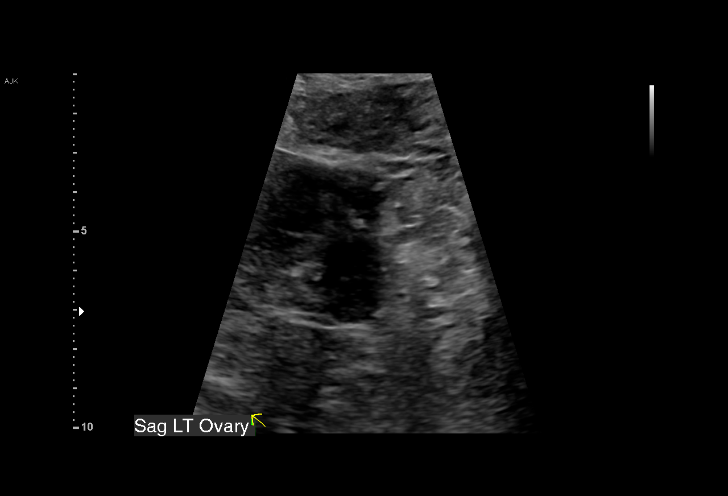
[im 22/43]
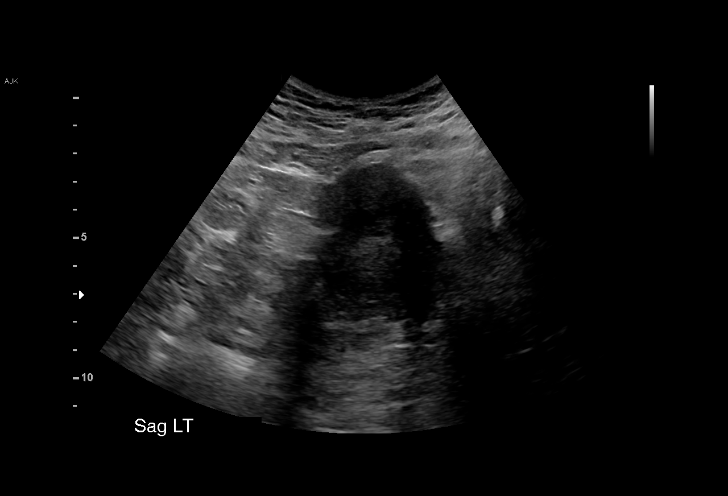
[im 24/43]
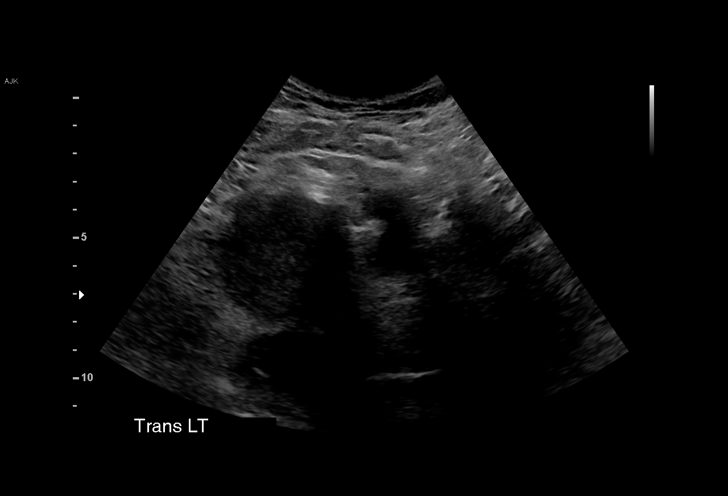
[im 27/43]
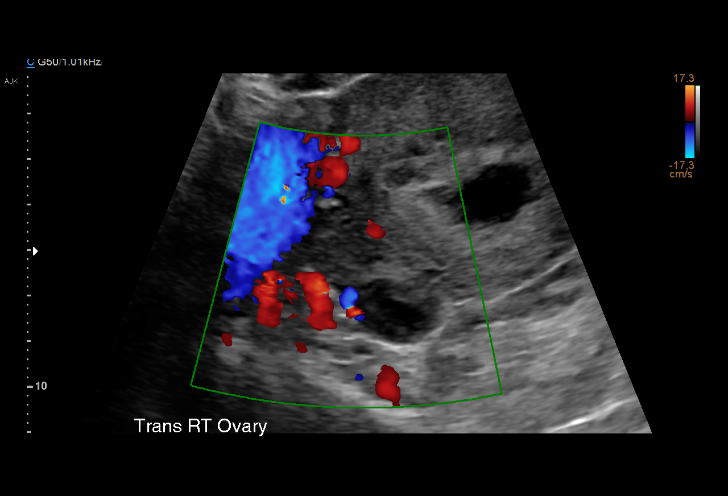
[im 30/43]
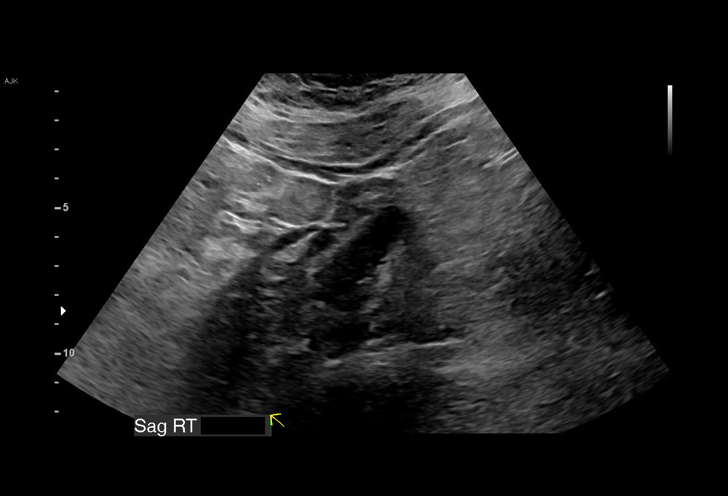
[im 33/43]
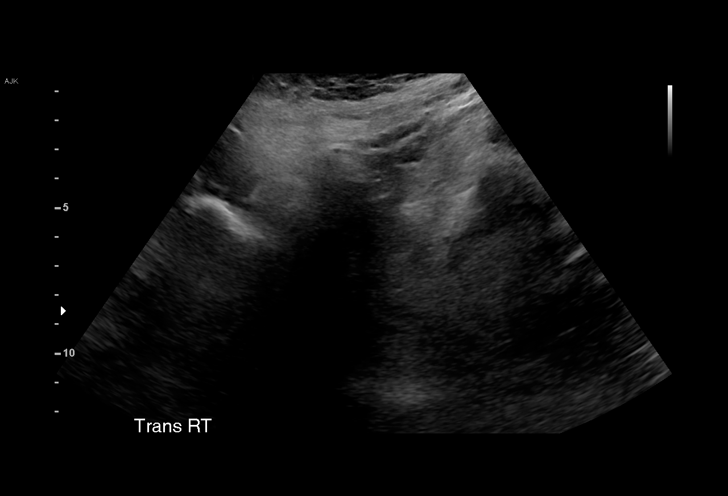
[im 36/43]
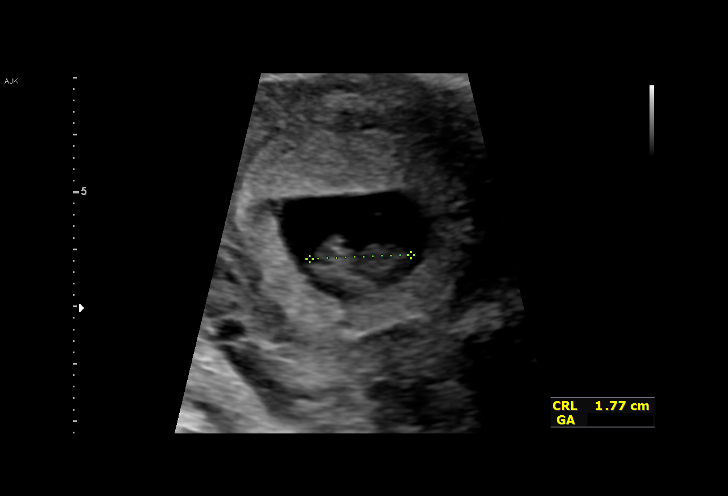
[im 39/43]
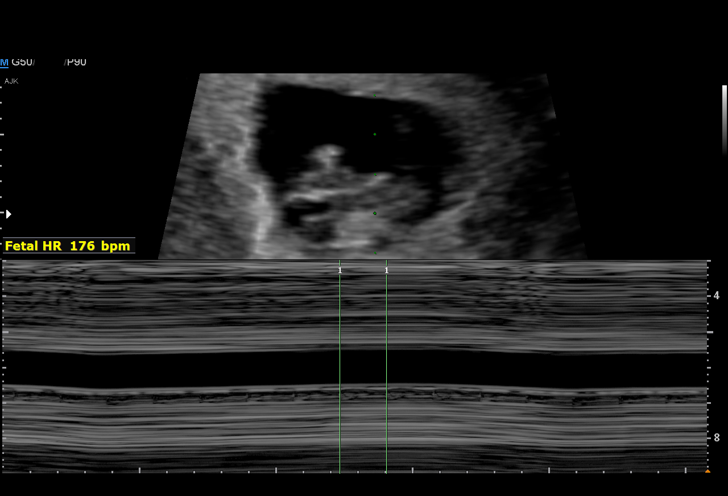
[im 43/43]
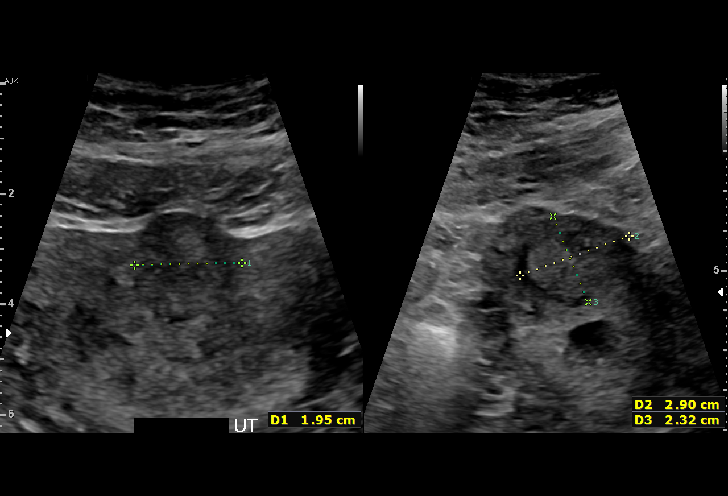

[15 of 28 positions shown; findings below may reference images not displayed]

FINDINGS: Intrauterine gestational sac: Single intrauterine gestational sac.

Yolk sac:  Seen

Embryo:  Present

Cardiac Activity: Detected

Heart Rate: 176 bpm

CRL: 18 mm   8 w 2 d                  US EDC: 05/27/2020

Subchorionic hemorrhage:  None visualized.

Maternal uterus/adnexae: There is a 2.9 x 2.3 x 2.0 cm anterior body
fibroid. The ovaries are unremarkable. There is a corpus luteum in
the right ovary.
IMPRESSION: Single live intrauterine pregnancy with an estimated gestational age
of 8 weeks, 2 days based on today's ultrasound concordant with age
based on LMP.

## 2022-06-07 IMAGING — US US MFM OB DETAIL+14 WK
1 series · 13 of 28 positions shown · non-contrast
Comparison: none

[Series 1: us mfm ob detail+14 wk · 110 acquisitions, 13 frames shown]
[im 5/110]
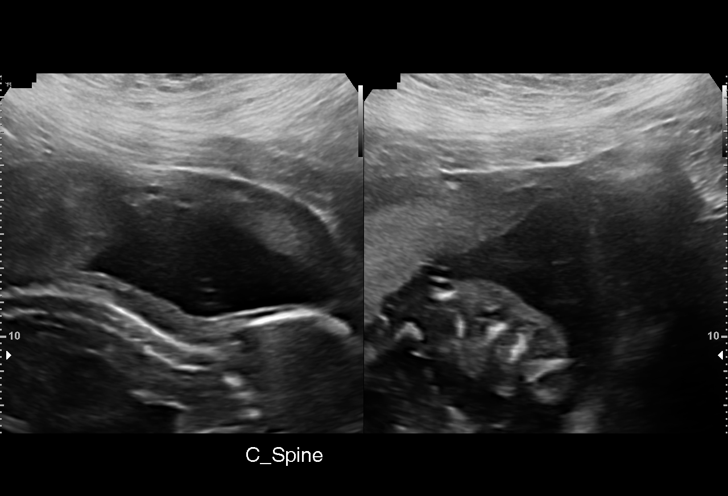
[im 13/110]
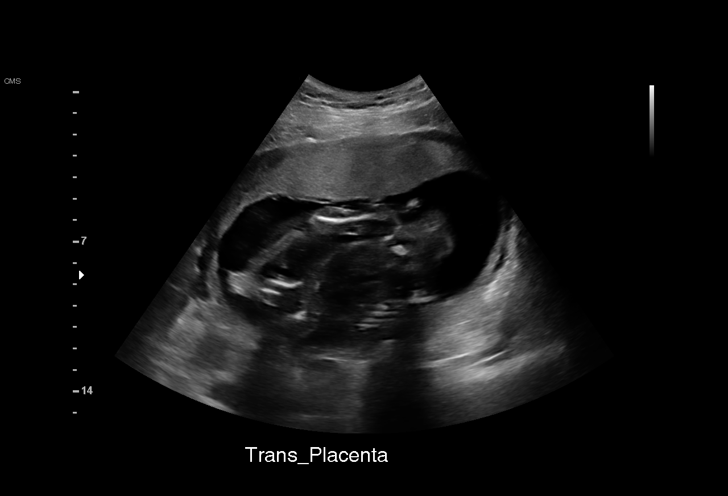
[im 21/110]
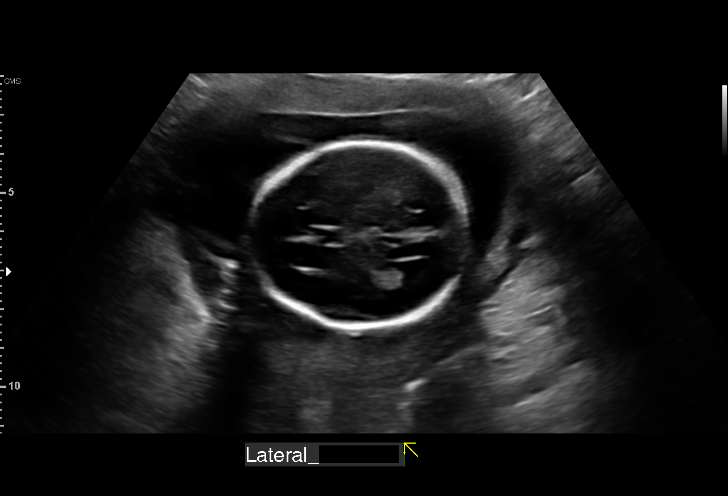
[im 29/110]
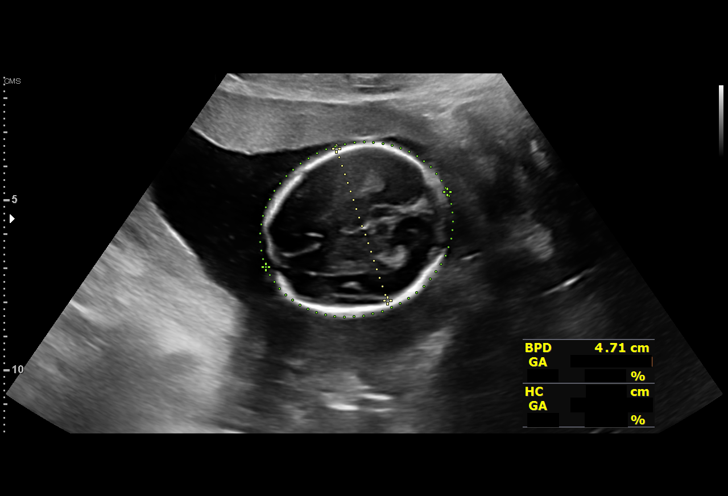
[im 37/110]
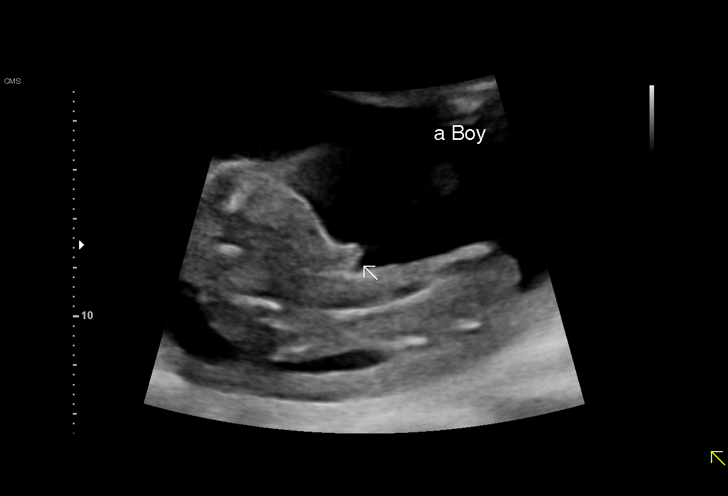
[im 45/110]
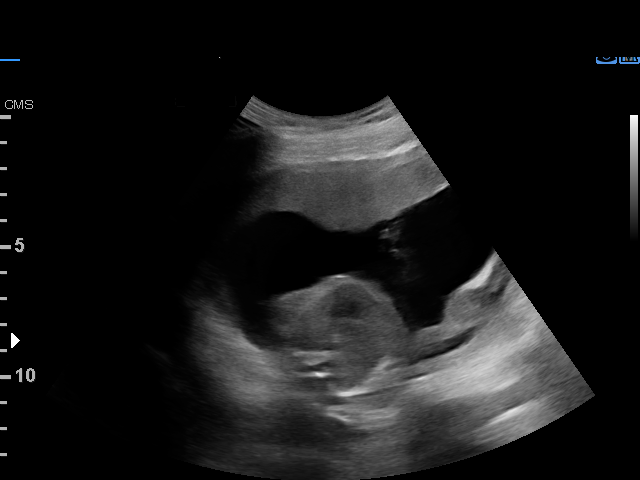
[im 57/110]
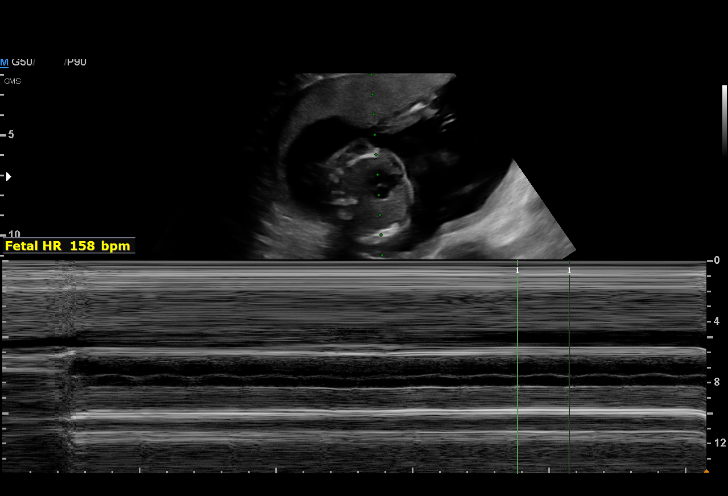
[im 65/110]
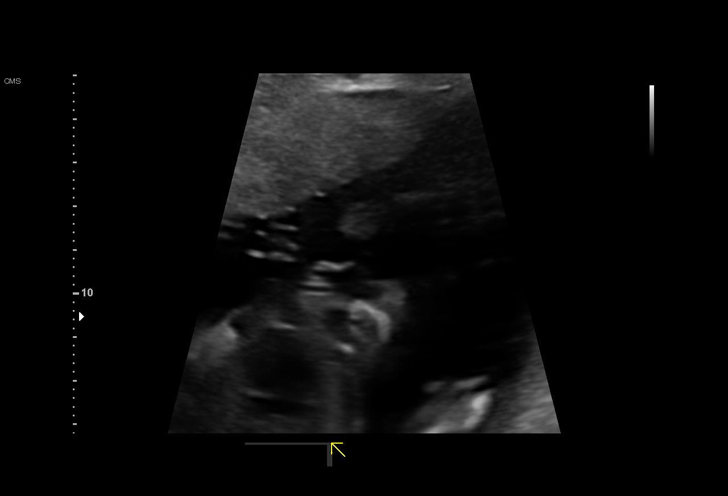
[im 73/110]
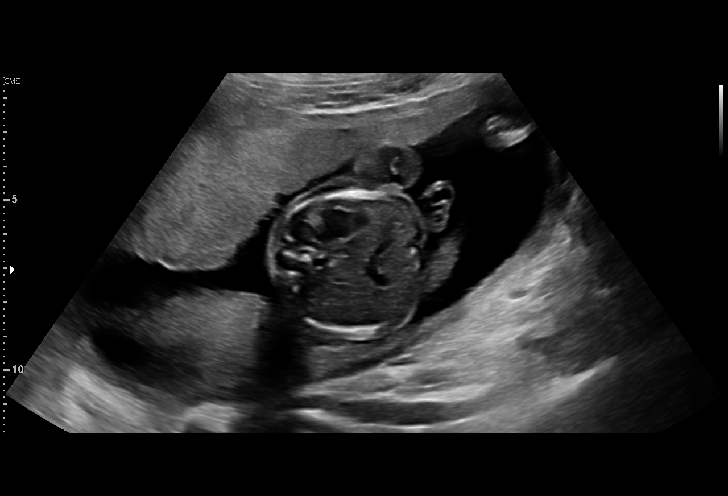
[im 81/110]
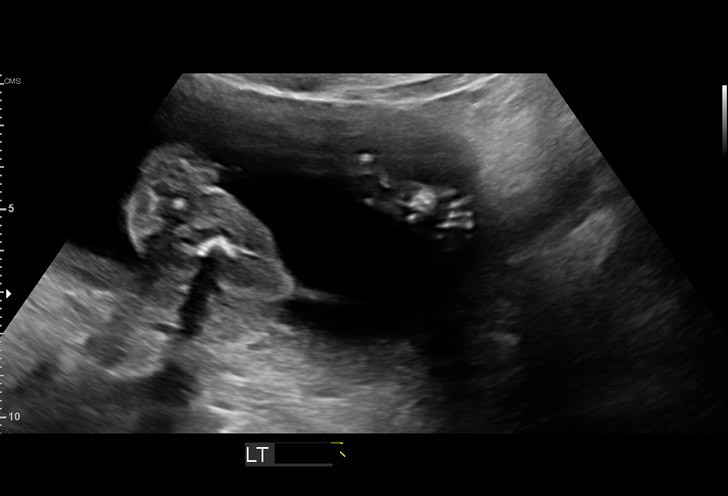
[im 89/110]
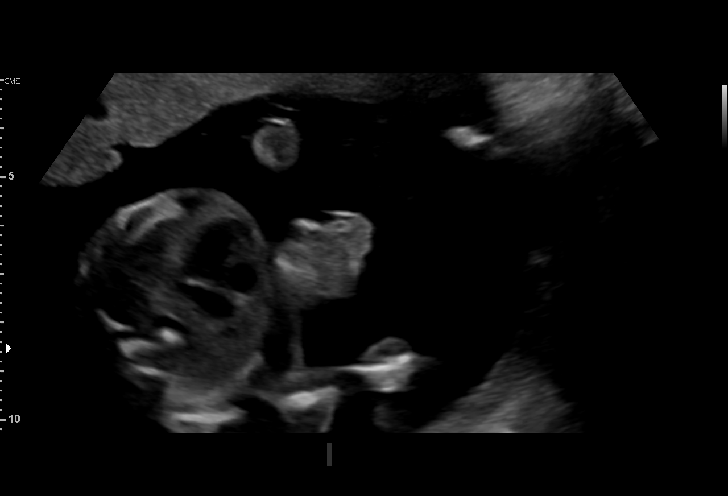
[im 97/110]
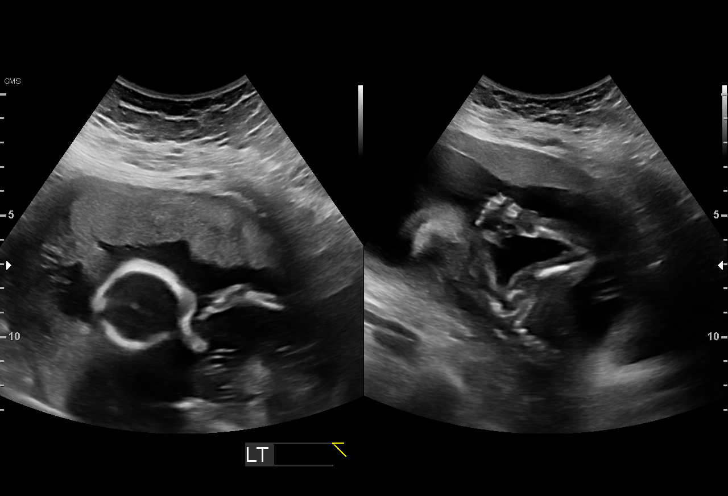
[im 105/110]
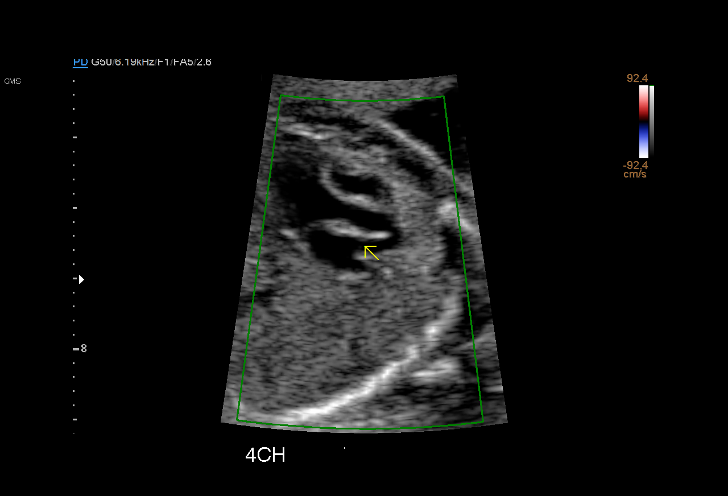

[13 of 28 positions shown; findings below may reference images not displayed]

Indications

 Antenatal screening for malformations (low
 risk NIPS, neg Horizon 14)
 Obesity complicating pregnancy, second
 trimester (pregravid BMI 39)
 Hypertension - Chronic/Pre-existing
 (labetalol)
 Poor obstetric history: Previous preterm
 delivery, antepartum x 3 (26, 35 and 35
 weeks - all for pre-e)
 Previous cesarean delivery, antepartum x 2
 20 weeks gestation of pregnancy
Fetal Evaluation

 Num Of Fetuses:         1
 Fetal Heart Rate(bpm):  158
 Cardiac Activity:       Observed
 Presentation:           Variable
 Placenta:               Anterior
 P. Cord Insertion:      Visualized

 Amniotic Fluid
 AFI FV:      Within normal limits

                             Largest Pocket(cm)

Biometry

 BPD:      47.5  mm     G. Age:  20w 3d         59  %    CI:        77.66   %    70 - 86
                                                         FL/HC:      17.4   %    16.8 -
 HC:      170.6  mm     G. Age:  19w 5d         21  %    HC/AC:      1.11        1.09 -
 AC:      153.3  mm     G. Age:  20w 4d         57  %    FL/BPD:     62.3   %
 FL:       29.6  mm     G. Age:  19w 1d         12  %    FL/AC:      19.3   %    20 - 24
 HUM:      30.3  mm     G. Age:  20w 0d         49  %
 CER:      21.5  mm     G. Age:  20w 2d         78  %

 CM:        4.8  mm
 Est. FW:     318  gm    0 lb 11 oz      30  %
OB History

 Gravidity:    6         Term:   0        Prem:   3        SAB:   2
 TOP:          0       Ectopic:  0        Living: 3
Gestational Age

 LMP:           20w 1d        Date:  08/18/19                 EDD:   05/24/20
 U/S Today:     20w 0d                                        EDD:   05/25/20
 Best:          20w 1d     Det. By:  LMP  (08/18/19)          EDD:   05/24/20
Anatomy

 Cranium:               Appears normal         LVOT:                   Appears normal
 Cavum:                 Appears normal         Aortic Arch:            Not well visualized
 Ventricles:            Appears normal         Ductal Arch:            Not well visualized
 Choroid Plexus:        Appears normal         Diaphragm:              Appears normal
 Cerebellum:            Appears normal         Stomach:                Appears normal, left
                                                                       sided
 Posterior Fossa:       Appears normal         Abdomen:                Multiple
                                                                       Calcifications
 Nuchal Fold:           Appears normal         Abdominal Wall:         Appears nml (cord
                                                                       insert, abd wall)
 Face:                  Appears normal         Cord Vessels:           Appears normal (3
                        (orbits and profile)                           vessel cord)
 Lips:                  Appears normal         Kidneys:                Appear normal
 Palate:                Not well visualized    Bladder:                Appears normal
 Thoracic:              Appears normal         Spine:                  Not well visualized
 Heart:                 Appears normal         Upper Extremities:      Visualized
                        (4CH, axis, and
                        situs)
 RVOT:                  Appears normal         Lower Extremities:      Appears normal

 Other:  Heels/feet and open hands/5th digits visualized. Fetus appears to be
         a male.
Cervix Uterus Adnexa

 Cervix
 Length:           3.39  cm.
 Normal appearance by transabdominal scan.
 Uterus
 No abnormality visualized.

 Right Ovary
 Within normal limits.

 Left Ovary
 Within normal limits.

 Cul De Sac
 No free fluid seen.

 Adnexa
 No abnormality visualized.
Impression

 G6 P3. Patient is here for fetal anatomy scan. On cell-free
 fetal DNA screening, the risks of fetal aneuploidies are not
 increased. Genetic screening showed ruled out carrier status
 of recessive disorders.
 Obstetric history is significant for 2 previous term cesarean
 deliveries. Patient has chronic hypertension and takes
 labetalol for control. Her blood pressure today at our office is
 108/54 mm Hg.
 We performed fetal anatomical survey. No obvious fetal
 structural defects are seen. Placenta is anterior and there is
 no evidence of previa or accreta.
 Few scattered calcifications are seen behind the stomach.
 Liver appears normal. Intracranial structures appear normal
 with no evidence of periventricular calcifications. No evidence
 of ascites or dilated bowel loops.
 I explained the findings including reassuring findings of
 normal anatomical survey. Calcifications can be associated
 with congenital infections including cytomegalovirus or
 toxoplasmosis infections. I recommended serology screening.
 I also informed her that amniocentesis can be performed to
 rule out infection if serology is positive.
 Patient will be having follow-up ultrasound at our office that
 afford us an opportunity to assess calcifications.
 Blood was drawn today for toxo and CMV IgG and IgM
 antibodies.
Recommendations

 -An appointment was made for her to return in 4 weeks for
 completion of fetal anatomy.
 -We will communicate serology results to the patient.

                 [place], Magel

## 2023-01-21 ENCOUNTER — Encounter (HOSPITAL_BASED_OUTPATIENT_CLINIC_OR_DEPARTMENT_OTHER): Payer: Self-pay

## 2023-01-21 ENCOUNTER — Other Ambulatory Visit: Payer: Self-pay

## 2023-01-21 ENCOUNTER — Emergency Department (HOSPITAL_BASED_OUTPATIENT_CLINIC_OR_DEPARTMENT_OTHER): Payer: Medicaid Other

## 2023-01-21 DIAGNOSIS — X500XXA Overexertion from strenuous movement or load, initial encounter: Secondary | ICD-10-CM | POA: Diagnosis not present

## 2023-01-21 DIAGNOSIS — I10 Essential (primary) hypertension: Secondary | ICD-10-CM | POA: Diagnosis not present

## 2023-01-21 DIAGNOSIS — M25512 Pain in left shoulder: Secondary | ICD-10-CM | POA: Insufficient documentation

## 2023-01-21 NOTE — ED Notes (Signed)
Patient transported to X-ray 

## 2023-01-21 NOTE — ED Triage Notes (Signed)
Left shoulder pain started Saturday and is now becoming worse. 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

## 2023-01-22 ENCOUNTER — Emergency Department (HOSPITAL_BASED_OUTPATIENT_CLINIC_OR_DEPARTMENT_OTHER)
Admission: EM | Admit: 2023-01-22 | Discharge: 2023-01-22 | Disposition: A | Discharge status: Home / Self Care | Payer: Medicaid Other | Attending: Emergency Medicine | Admitting: Emergency Medicine

## 2023-01-22 DIAGNOSIS — M25512 Pain in left shoulder: Secondary | ICD-10-CM

## 2023-01-22 MED ORDER — DICLOFENAC SODIUM 1 % EX GEL: 2.0000 g | Freq: Four times a day (QID) | CUTANEOUS | 0 refills | Status: AC | PRN

## 2023-01-22 MED ORDER — MELOXICAM 15 MG PO TABS: 15.0000 mg | ORAL_TABLET | Freq: Every day | ORAL | 0 refills | Status: AC

## 2023-01-22 MED ORDER — SENNOSIDES-DOCUSATE SODIUM 8.6-50 MG PO TABS: 1.0000 | ORAL_TABLET | Freq: Every evening | ORAL | 0 refills | Status: AC | PRN

## 2023-01-22 MED ORDER — OXYCODONE-ACETAMINOPHEN 5-325 MG PO TABS
2.0000 | ORAL_TABLET | Freq: Once | ORAL | Status: AC
  Administered 2023-01-22: 2 via ORAL
  Filled 2023-01-22: qty 2

## 2023-01-22 MED ORDER — KETOROLAC TROMETHAMINE 30 MG/ML IJ SOLN
30.0000 mg | Freq: Once | INTRAMUSCULAR | Status: AC
  Administered 2023-01-22: 30 mg via INTRAMUSCULAR
  Filled 2023-01-22: qty 1

## 2023-01-22 MED ORDER — OXYCODONE-ACETAMINOPHEN 5-325 MG PO TABS
1.0000 | ORAL_TABLET | Freq: Four times a day (QID) | ORAL | 0 refills | Status: AC | PRN
Start: 2023-01-22 — End: ?

## 2023-01-22 NOTE — ED Provider Notes (Signed)
Emergency Department Provider Note   I have reviewed the triage vital signs and the nursing notes.   HISTORY  Chief Complaint Shoulder Pain   HPI Megan Mcneil is a 37 y.o. female presents to the emergency department for evaluation of left shoulder pain.  The patient works at [employer] and her job requires heavy lifting and moving.  She does not recall any specific injury.  No fevers or chills.  She has pain radiating from the left shoulder to the left lateral neck.  No numbness or weakness.  Pain is worse with moving the shoulder.  No chest pain or shortness of breath.  She has tried taking muscle relaxers without relief.   Past Medical History:  Diagnosis Date   Endometriosis    Headache    Hypertension    IBS (irritable bowel syndrome)    w/ Diarrhea   Pancreatitis    PCOS (polycystic ovarian syndrome)    Tachycardia     Review of Systems  Constitutional: No fever/chills Cardiovascular: Denies chest pain. Respiratory: Denies shortness of breath. Gastrointestinal: No abdominal pain.  Musculoskeletal: Positive left shoulder.  Skin: Negative for rash. Neurological: Negative for weakness/numbness.    ____________________________________________   PHYSICAL EXAM:  VITAL SIGNS: ED Triage Vitals  Encounter Vitals Group     BP 01/21/23 2154 (!) 125/90     Pulse Rate 01/21/23 2154 97     Resp 01/21/23 2154 18     Temp 01/21/23 2154 97.8 F (36.6 C)     Temp src --      SpO2 01/21/23 2154 94 %     Weight 01/21/23 2149 180 lb (81.6 kg)     Height 01/21/23 2149 5\' 3"  (1.6 m)   Constitutional: Alert and oriented. Well appearing and in no acute distress. Eyes: Conjunctivae are normal.  Head: Atraumatic. Nose: No congestion/rhinnorhea. Mouth/Throat: Mucous membranes are moist.  Neck: No stridor.  Cardiovascular: Good peripheral circulation. Respiratory: Normal respiratory effort.   Gastrointestinal: No distention.  Musculoskeletal: Pain with abduction of  the left shoulder beyond 90 degrees.  No joint crepitus or obvious effusion.  No warmth to touch.  Neurologic:  Normal speech and language. No gross focal neurologic deficits are appreciated.  Skin:  Skin is warm, dry and intact. No rash noted. _____________________________________  RADIOLOGY  DG Shoulder Left  Result Date: 01/22/2023 CLINICAL DATA:  Pain EXAM: LEFT SHOULDER - 2+ VIEW COMPARISON:  None Available. FINDINGS: There is no evidence of fracture or dislocation. There is no evidence of arthropathy or other focal bone abnormality. Soft tissues are unremarkable. IMPRESSION: Negative. Electronically Signed   By: Darliss Cheney M.D.   On: 01/22/2023 01:43    ____________________________________________   PROCEDURES  Procedure(s) performed:   Procedures  None  ____________________________________________   INITIAL IMPRESSION / ASSESSMENT AND PLAN / ED COURSE  Pertinent labs & imaging results that were available during my care of the patient were reviewed by me and considered in my medical decision making (see chart for details).   This patient is Presenting for Evaluation of shoulder pain, which does require a range of treatment options, and is a complaint that involves a high risk of morbidity and mortality.  The Differential Diagnoses includes rotator cuff injury, septic joint, fracture, dislocation, etc.   Critical Interventions-    Medications  ketorolac (TORADOL) 30 MG/ML injection 30 mg (30 mg Intramuscular Given 01/22/23 0103)  oxyCODONE-acetaminophen (PERCOCET/ROXICET) 5-325 MG per tablet 2 tablet (2 tablets Oral Given 01/22/23 0205)  Reassessment after intervention: pain improved.    I did obtain Additional Historical Information from [facility] at bedside.  I decided to review pertinent External Data, and in summary PDMP reviewed. No recent opiate Rx.  Radiologic Tests Ordered, included shoulder XR. I independently interpreted the images and agree with radiology  interpretation.   Medical Decision Making: Summary:  Patient presents to the emergency department with left shoulder pain.  No evidence of septic joint.  X-ray without acute abnormality.  Plan for pain management, anti-inflammatory medication, orthopedics referral for outpatient evaluation. Work note provided.   Patient's presentation is most consistent with acute, uncomplicated illness.   Disposition: discharge  ____________________________________________  FINAL CLINICAL IMPRESSION(S) / ED DIAGNOSES  Final diagnoses:  Acute pain of left shoulder     NEW OUTPATIENT MEDICATIONS STARTED DURING THIS VISIT:  Discharge Medication List as of 01/22/2023  2:00 AM     START taking these medications   Details  diclofenac Sodium (VOLTAREN) 1 % GEL Apply 2 g topically 4 (four) times daily as needed., Starting Thu 01/22/2023, Normal    meloxicam (MOBIC) 15 MG tablet Take 1 tablet (15 mg total) by mouth daily for 7 days., Starting Thu 01/22/2023, Until Thu 01/29/2023, Normal    oxyCODONE-acetaminophen (PERCOCET/ROXICET) 5-325 MG tablet Take 1 tablet by mouth every 6 (six) hours as needed for severe pain (pain score 7-10)., Starting Thu 01/22/2023, Normal    senna-docusate (SENOKOT-S) 8.6-50 MG tablet Take 1 tablet by mouth at bedtime as needed for mild constipation., Starting Thu 01/22/2023, Normal        Note:  This document was prepared using Dragon voice recognition software and may include unintentional dictation errors.  Alona Bene, MD, [facility]    Damyia Strider, Arlyss Repress, MD 01/22/23 [phone]

## 2023-01-22 NOTE — Discharge Instructions (Signed)
We believe that your symptoms are caused by musculoskeletal strain.  Please read through the included information about additional care such as heating pads, over-the-counter pain medicine.  If you were provided a prescription please use it only as needed and as instructed.  Remember that early mobility and using the affected part of your body is actually better than keeping it immobile.  Follow-up with the doctor listed as recommended or return to the emergency department with new or worsening symptoms that concern you.
